# Patient Record
Sex: Female | Born: 1960 | Race: Asian | Hispanic: No | Marital: Married | Smoking: Never smoker
Health system: Southern US, Community
[De-identification: ages and names within clinical notes are randomized; demographics above are authoritative.]

## PROBLEM LIST (undated history)

## (undated) DIAGNOSIS — I5030 Unspecified diastolic (congestive) heart failure: Secondary | ICD-10-CM

## (undated) DIAGNOSIS — R011 Cardiac murmur, unspecified: Secondary | ICD-10-CM

## (undated) DIAGNOSIS — E079 Disorder of thyroid, unspecified: Secondary | ICD-10-CM

## (undated) DIAGNOSIS — K219 Gastro-esophageal reflux disease without esophagitis: Secondary | ICD-10-CM

## (undated) HISTORY — PX: COLONOSCOPY: SHX174

## (undated) HISTORY — DX: Disorder of thyroid, unspecified: E07.9

## (undated) HISTORY — PX: EYE SURGERY: SHX253

## (undated) HISTORY — PX: UPPER GASTROINTESTINAL ENDOSCOPY: SHX188

## (undated) HISTORY — DX: Gastro-esophageal reflux disease without esophagitis: K21.9

## (undated) HISTORY — DX: Cardiac murmur, unspecified: R01.1

## (undated) HISTORY — PX: ESOPHAGOGASTRODUODENOSCOPY: SHX1529

## (undated) HISTORY — DX: Unspecified diastolic (congestive) heart failure: I50.30

---

## 1997-04-16 ENCOUNTER — Inpatient Hospital Stay (HOSPITAL_COMMUNITY): Admission: AD | Admit: 1997-04-16 | Discharge: 1997-04-16 | Payer: Self-pay | Admitting: Obstetrics & Gynecology

## 1997-07-07 ENCOUNTER — Other Ambulatory Visit: Admission: RE | Admit: 1997-07-07 | Discharge: 1997-07-07 | Payer: Self-pay | Admitting: Obstetrics & Gynecology

## 1997-08-01 ENCOUNTER — Inpatient Hospital Stay (HOSPITAL_COMMUNITY): Admission: AD | Admit: 1997-08-01 | Discharge: 1997-08-03 | Payer: Self-pay | Admitting: Obstetrics & Gynecology

## 1997-09-13 ENCOUNTER — Other Ambulatory Visit: Admission: RE | Admit: 1997-09-13 | Discharge: 1997-09-13 | Payer: Self-pay | Admitting: Obstetrics and Gynecology

## 1998-04-25 ENCOUNTER — Other Ambulatory Visit: Admission: RE | Admit: 1998-04-25 | Discharge: 1998-04-25 | Payer: Self-pay | Admitting: Obstetrics and Gynecology

## 1998-12-05 ENCOUNTER — Inpatient Hospital Stay (HOSPITAL_COMMUNITY): Admission: AD | Admit: 1998-12-05 | Discharge: 1998-12-07 | Payer: Self-pay | Admitting: Obstetrics and Gynecology

## 2002-04-07 ENCOUNTER — Other Ambulatory Visit: Admission: RE | Admit: 2002-04-07 | Discharge: 2002-04-07 | Payer: Self-pay | Admitting: Obstetrics and Gynecology

## 2004-07-05 ENCOUNTER — Other Ambulatory Visit: Admission: RE | Admit: 2004-07-05 | Discharge: 2004-07-05 | Payer: Self-pay | Admitting: Obstetrics and Gynecology

## 2005-07-05 ENCOUNTER — Encounter: Payer: Self-pay | Admitting: Family Medicine

## 2005-07-05 ENCOUNTER — Other Ambulatory Visit: Admission: RE | Admit: 2005-07-05 | Discharge: 2005-07-05 | Payer: Self-pay | Admitting: Family Medicine

## 2005-07-05 ENCOUNTER — Ambulatory Visit: Payer: Self-pay | Admitting: Family Medicine

## 2007-01-07 ENCOUNTER — Encounter: Admission: RE | Admit: 2007-01-07 | Discharge: 2007-01-07 | Payer: Self-pay | Admitting: Family Medicine

## 2007-09-02 ENCOUNTER — Encounter: Admission: RE | Admit: 2007-09-02 | Discharge: 2007-09-02 | Payer: Self-pay | Admitting: Family Medicine

## 2007-09-09 ENCOUNTER — Encounter: Admission: RE | Admit: 2007-09-09 | Discharge: 2007-09-09 | Payer: Self-pay | Admitting: Family Medicine

## 2008-06-27 ENCOUNTER — Encounter: Payer: Self-pay | Admitting: Family Medicine

## 2008-06-27 ENCOUNTER — Other Ambulatory Visit: Admission: RE | Admit: 2008-06-27 | Discharge: 2008-06-27 | Payer: Self-pay | Admitting: Family Medicine

## 2008-06-27 ENCOUNTER — Ambulatory Visit: Payer: Self-pay | Admitting: Family Medicine

## 2008-06-27 DIAGNOSIS — E039 Hypothyroidism, unspecified: Secondary | ICD-10-CM | POA: Insufficient documentation

## 2008-06-27 LAB — CONVERTED CEMR LAB
Bilirubin Urine: NEGATIVE
Protein, U semiquant: NEGATIVE
Specific Gravity, Urine: <1.005

## 2008-06-28 ENCOUNTER — Telehealth: Payer: Self-pay | Admitting: Family Medicine

## 2008-07-04 ENCOUNTER — Encounter: Payer: Self-pay | Admitting: Family Medicine

## 2008-07-04 LAB — CONVERTED CEMR LAB
Albumin: 4.1 g/dL (ref 3.5–5.2)
Alkaline Phosphatase: 45 units/L (ref 39–117)
Basophils Absolute: 0 10*3/uL (ref 0.0–0.1)
Bilirubin, Direct: 0.1 mg/dL (ref 0.0–0.3)
CO2: 31 meq/L (ref 19–32)
Chloride: 104 meq/L (ref 96–112)
Eosinophils Relative: 2.1 % (ref 0.0–5.0)
Glucose, Bld: 81 mg/dL (ref 70–99)
Lymphs Abs: 1.1 10*3/uL (ref 0.7–4.0)
MCHC: 34.6 g/dL (ref 30.0–36.0)
MCV: 86.5 fL (ref 78.0–100.0)
Monocytes Relative: 9.1 % (ref 3.0–12.0)
Neutro Abs: 3.2 10*3/uL (ref 1.4–7.7)
Potassium: 3.4 meq/L — ABNORMAL LOW (ref 3.5–5.1)
Sodium: 142 meq/L (ref 135–145)
TSH: 2.32 microintl units/mL (ref 0.35–5.50)
Total Protein: 7.9 g/dL (ref 6.0–8.3)
WBC: 4.8 10*3/uL (ref 4.5–10.5)

## 2008-09-05 ENCOUNTER — Encounter: Admission: RE | Admit: 2008-09-05 | Discharge: 2008-09-05 | Payer: Self-pay | Admitting: Family Medicine

## 2009-06-19 ENCOUNTER — Ambulatory Visit: Payer: Self-pay | Admitting: Family Medicine

## 2009-06-19 ENCOUNTER — Other Ambulatory Visit: Admission: RE | Admit: 2009-06-19 | Discharge: 2009-06-19 | Payer: Self-pay | Admitting: Family Medicine

## 2009-06-19 DIAGNOSIS — N6019 Diffuse cystic mastopathy of unspecified breast: Secondary | ICD-10-CM | POA: Insufficient documentation

## 2009-06-19 DIAGNOSIS — J309 Allergic rhinitis, unspecified: Secondary | ICD-10-CM | POA: Insufficient documentation

## 2009-06-19 LAB — HM PAP SMEAR

## 2009-10-11 ENCOUNTER — Encounter: Admission: RE | Admit: 2009-10-11 | Discharge: 2009-10-11 | Payer: Self-pay | Admitting: Family Medicine

## 2009-10-16 ENCOUNTER — Encounter: Payer: Self-pay | Admitting: Family Medicine

## 2009-10-18 ENCOUNTER — Encounter: Admission: RE | Admit: 2009-10-18 | Discharge: 2009-10-18 | Payer: Self-pay | Admitting: Family Medicine

## 2009-10-18 LAB — HM MAMMOGRAPHY

## 2010-04-08 LAB — CONVERTED CEMR LAB
Basophils Relative: 0.8 % (ref 0.0–3.0)
Bilirubin Urine: NEGATIVE
CO2: 30 meq/L (ref 19–32)
Chloride: 103 meq/L (ref 96–112)
Creatinine, Ser: 0.5 mg/dL (ref 0.4–1.2)
Eosinophils Absolute: 0.1 10*3/uL (ref 0.0–0.7)
Eosinophils Relative: 1.4 % (ref 0.0–5.0)
Hemoglobin: 14 g/dL (ref 12.0–15.0)
Ketones, urine, test strip: NEGATIVE
Lymphocytes Relative: 20.4 % (ref 12.0–46.0)
Lymphs Abs: 1.1 10*3/uL (ref 0.7–4.0)
Monocytes Absolute: 0.5 10*3/uL (ref 0.1–1.0)
Platelets: 319 10*3/uL (ref 150.0–400.0)
Potassium: 4.4 meq/L (ref 3.5–5.1)
Protein, U semiquant: NEGATIVE
RBC: 4.74 M/uL (ref 3.87–5.11)
Urobilinogen, UA: 0.2
WBC: 5.2 10*3/uL (ref 4.5–10.5)

## 2010-04-12 NOTE — Assessment & Plan Note (Signed)
Summary: F/U ON THYROID / /RS   Vital Signs:  Patient profile:   50 year old female Menstrual status:  regular LMP:     06/01/2009 Height:      62 inches Weight:      94 pounds BMI:     17.25 Temp:     98.1 degrees F oral BP sitting:   102 / 68  (left arm) Cuff size:   regular  Vitals Entered By: Kern Reap CMA Duncan Dull) (June 19, 2009 10:03 AM) CC: follow-up visit LMP (date): 06/01/2009     Menstrual Status regular Enter LMP: 06/01/2009 Last PAP Result NEGATIVE FOR INTRAEPITHELIAL LESIONS OR MALIGNANCY.   CC:  follow-up visit.  History of Present Illness: Traci Houston is a 50 year old female, who comes in today for physical examination  She has a history of underlying hypothyroidism and takes Synthroid 100 micrograms daily.  Will check thyroid level today.  She also has recurrent contact dermatitis.  Would like some treatment for that.  She also has allergic rhinitis.  Will likely treatment for that too.  Periods are regular for birth control.  They use condoms.  Mammogram shows diffuse fibrocystic changes.  No change on her exam at home  Allergies: No Known Drug Allergies  Past History:  Past medical, surgical, family and social histories (including risk factors) reviewed, and no changes noted (except as noted below).  Past Medical History: Reviewed history from 06/27/2008 and no changes required. thyroid  Family History: Reviewed history and no changes required.  Social History: Reviewed history from 06/27/2008 and no changes required. 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 No apparent alopecia or balding. Eyes:  No corneal or conjunctival inflammation  noted. EOMI. Perrla. Funduscopic exam benign, without hemorrhages, exudates or papilledema. Vision grossly normal. Ears:  External ear exam shows no significant lesions or deformities.  Otoscopic examination reveals clear canals, tympanic membranes are intact bilaterally without bulging, retraction, inflammation or discharge. Hearing is grossly normal bilaterally. Nose:  External nasal examination shows no deformity or inflammation. Nasal mucosa are pink and moist without lesions or exudates. Mouth:  Oral mucosa and oropharynx without lesions or exudates.  Teeth in good repair. Neck:  No deformities, masses, or tenderness noted. Chest Wall:  No deformities, masses, or tenderness noted. Breasts:  the breasts appear symmetrical.  There is no skin changes.  No nipple discharge.  Diffuse fibrocystic changes throughout both breasts Lungs:  Normal respiratory effort, chest expands symmetrically. Lungs are clear to auscultation, no crackles or wheezes. Heart:  Normal rate and regular rhythm. S1 and S2 normal without gallop, murmur, click, rub or other extra sounds. Abdomen:  Bowel sounds positive,abdomen soft and non-tender without masses, organomegaly or hernias noted. Rectal:  No external abnormalities noted. Normal sphincter tone. No rectal masses or tenderness. Genitalia:  Pelvic Exam:        External: normal female genitalia without lesions or masses        Vagina: normal without lesions or masses        Cervix: normal without lesions or masses        Adnexa: normal bimanual exam without masses or fullness        Uterus: normal by palpation        Pap smear:  performed Msk:  No deformity or scoliosis noted of thoracic or lumbar spine.   Pulses:  R and L carotid,radial,femoral,dorsalis pedis and posterior tibial pulses are full and equal bilaterally Extremities:  No clubbing, cyanosis, edema, or deformity noted with normal full range of motion of all joints.   Neurologic:  No cranial nerve deficits  noted. Station and gait are normal. Plantar reflexes are down-going bilaterally. DTRs are symmetrical throughout. Sensory, motor and coordinative functions appear intact. Skin:  total body skin exam normal except for some contact dermatitis Cervical Nodes:  No lymphadenopathy noted Axillary Nodes:  No palpable lymphadenopathy Inguinal Nodes:  No significant adenopathy Psych:  Cognition and judgment appear intact. Alert and cooperative with normal attention span and concentration. No apparent delusions, illusions, hallucinations   Problems:  Medical Problems Added: 1)  Dx of Fibrocystic Breast Disease  (ICD-610.1) 2)  Dx of Physical Examination  (ICD-V70.0) 3)  Dx of Rhinitis  (ICD-477.9) 4)  Dx of Contact Dermatitis-nos  (ICD-692.9)  Impression & Recommendations:  Problem # 1:  UNSPECIFIED HYPOTHYROIDISM (ICD-244.9) Assessment Improved  Her updated medication list for this problem includes:    Synthroid 100 Mcg Tabs (Levothyroxine sodium) .Marland Kitchen... Take one tab once daily  Orders: Prescription Created Electronically [id number]) Venipuncture (60454) TLB-BMP (Basic Metabolic Panel-BMET) (80048-METABOL) TLB-CBC Platelet - w/Differential (85025-CBCD) TLB-TSH (Thyroid Stimulating Hormone) (84443-TSH)  Problem # 2:  Preventive Health Care (ICD-V70.0) Assessment: Unchanged  Problem # 3:  RHINITIS (ICD-477.9) Assessment: New  Orders: Prescription Created Electronically [phone]) Venipuncture [phone]) TLB-BMP (Basic Metabolic Panel-BMET) (80048-METABOL) TLB-CBC Platelet - w/Differential (85025-CBCD) TLB-TSH (Thyroid Stimulating Hormone) (84443-TSH)  Problem # 4:  CONTACT DERMATITIS-NOS (ICD-692.9) Assessment: New  Her updated medication list for this problem includes:    Triamcinolone Acetonide 0.025 % Oint (Triamcinolone acetonide) .Marland Kitchen... Apply two times a day as needed    Prednisone 20 Mg Tabs (Prednisone) ..... Uad  Orders: Prescription Created Electronically [phone]) Venipuncture  [phone]) TLB-BMP (Basic Metabolic Panel-BMET) (80048-METABOL) TLB-CBC Platelet - w/Differential (85025-CBCD) TLB-TSH (Thyroid Stimulating Hormone) (84443-TSH)  Complete Medication List: 1)  Synthroid 100 Mcg Tabs (Levothyroxine sodium) .... Take one tab once daily 2)  Triamcinolone Acetonide 0.025 % Oint (Triamcinolone acetonide) .... Apply two times a day as needed 3)  Prednisone 20 Mg Tabs (Prednisone) .... Uad  Other Orders: UA Dipstick w/o Micro (automated)  (81003) EKG w/ Interpretation (93000)  Patient Instructions: 1)  for minor outbreaks of the contact dermatitis apply   The cortisone cream twice daily. 2)  If you breakout all over and then take the prednisone tablets, one for 3 days a half for 3 days, then half a tablet Monday, Wednesday, Friday, for two weeks. 3)  u  may also take plain Claritin in the morning for your allergic rhinitis. 4)  Continue your thyroid tablet daily 5)  Please schedule a follow-up appointment in 1 year. 6)  Schedule your mammogram. Prescriptions: SYNTHROID 100 MCG TABS (LEVOTHYROXINE SODIUM) take one tab once daily  #100 x 3   Entered and Authorized by:   Roderick Pee MD   Signed by:   Roderick Pee MD on 06/19/2009   Method used:   Electronically to        [facility]* (mail-order)             ,          Ph: [phone]       Fax: [phone]   RxID:   [id number] PREDNISONE 20 MG TABS (PREDNISONE) UAD  #30 x 2  Entered and Authorized by:   Roderick Pee MD   Signed by:   Roderick Pee MD on 06/19/2009   Method used:   Print then Give to Patient   RxID:   [id number] TRIAMCINOLONE ACETONIDE 0.025 % OINT (TRIAMCINOLONE ACETONIDE) apply two times a day as needed  #30 gr. x 3   Entered and Authorized by:   Roderick Pee MD   Signed by:   Roderick Pee MD on 06/19/2009   Method used:   Print then Give to Patient   RxID:   [id number]     Laboratory Results   Urine Tests    Routine Urinalysis   Color:  yellow Appearance: Clear Glucose: negative   (Normal Range: Negative) Bilirubin: negative   (Normal Range: Negative) Ketone: negative   (Normal Range: Negative) Spec. Gravity: <1.005   (Normal Range: 1.003-1.035) Blood: 1+   (Normal Range: Negative) pH: 5.0   (Normal Range: 5.0-8.0) Protein: negative   (Normal Range: Negative) Urobilinogen: 0.2   (Normal Range: 0-1) Nitrite: negative   (Normal Range: Negative) Leukocyte Esterace: negative   (Normal Range: Negative)    Comments: Rita Ohara  June 19, 2009 11:13 AM

## 2010-06-13 ENCOUNTER — Other Ambulatory Visit: Payer: Self-pay | Admitting: Family Medicine

## 2010-07-06 ENCOUNTER — Encounter: Payer: Self-pay | Admitting: Family Medicine

## 2010-07-09 ENCOUNTER — Encounter: Payer: Self-pay | Admitting: Family Medicine

## 2010-07-09 ENCOUNTER — Ambulatory Visit (INDEPENDENT_AMBULATORY_CARE_PROVIDER_SITE_OTHER): Payer: BC Managed Care – PPO | Admitting: Family Medicine

## 2010-07-09 ENCOUNTER — Other Ambulatory Visit (HOSPITAL_COMMUNITY)
Admission: RE | Admit: 2010-07-09 | Discharge: 2010-07-09 | Disposition: A | Discharge status: Home / Self Care | Payer: BC Managed Care – PPO | Source: Ambulatory Visit | Attending: Family Medicine | Admitting: Family Medicine

## 2010-07-09 VITALS — BP 92/62 | Temp 98.3°F | Ht 60.5 in | Wt 90.0 lb

## 2010-07-09 DIAGNOSIS — E039 Hypothyroidism, unspecified: Secondary | ICD-10-CM

## 2010-07-09 DIAGNOSIS — Z01419 Encounter for gynecological examination (general) (routine) without abnormal findings: Secondary | ICD-10-CM | POA: Insufficient documentation

## 2010-07-09 DIAGNOSIS — N6019 Diffuse cystic mastopathy of unspecified breast: Secondary | ICD-10-CM

## 2010-07-09 DIAGNOSIS — Z0000 Encounter for general adult medical examination without abnormal findings: Secondary | ICD-10-CM

## 2010-07-09 LAB — BASIC METABOLIC PANEL
Calcium: 8.6 mg/dL (ref 8.4–10.5)
Chloride: 100 mEq/L (ref 96–112)
Creatinine, Ser: 0.5 mg/dL (ref 0.4–1.2)
GFR: 152.89 mL/min (ref >60.00)
Sodium: 135 mEq/L (ref 135–145)

## 2010-07-09 LAB — HEPATIC FUNCTION PANEL
ALT: 7 U/L (ref 0–35)
Alkaline Phosphatase: 36 U/L — ABNORMAL LOW (ref 39–117)
Total Bilirubin: 0.6 mg/dL (ref 0.3–1.2)

## 2010-07-09 LAB — LIPID PANEL
HDL: 50.2 mg/dL (ref >39.00)
LDL Cholesterol: 90 mg/dL (ref 0–99)
Total CHOL/HDL Ratio: 3
Triglycerides: 47 mg/dL (ref 0.0–149.0)
VLDL: 9.4 mg/dL (ref 0.0–40.0)

## 2010-07-09 LAB — CBC WITH DIFFERENTIAL/PLATELET
Basophils Absolute: 0 10*3/uL (ref 0.0–0.1)
Basophils Relative: 0.5 % (ref 0.0–3.0)
Hemoglobin: 12.2 g/dL (ref 12.0–15.0)
Lymphocytes Relative: 28.4 % (ref 12.0–46.0)
MCHC: 33.9 g/dL (ref 30.0–36.0)
Monocytes Relative: 9.5 % (ref 3.0–12.0)
Neutro Abs: 3.1 10*3/uL (ref 1.4–7.7)
Platelets: 266 10*3/uL (ref 150.0–400.0)
RDW: 15.5 % — ABNORMAL HIGH (ref 11.5–14.6)

## 2010-07-09 LAB — POCT URINALYSIS DIPSTICK
Bilirubin, UA: NEGATIVE
Glucose, UA: NEGATIVE
Ketones, UA: NEGATIVE
Leukocytes, UA: NEGATIVE
Urobilinogen, UA: 0.2

## 2010-07-09 MED ORDER — LEVOTHYROXINE SODIUM 100 MCG PO TABS: 100.0000 ug | ORAL_TABLET | Freq: Every day | ORAL | Status: DC

## 2010-07-09 NOTE — Patient Instructions (Addendum)
Continue your current medications.  Schedule a 30 minute appointment in one year For your annual   Physical exam  In the future get her lab work one week prior

## 2010-07-09 NOTE — Progress Notes (Signed)
  Subjective:    Patient ID: Traci Houston, female    DOB: [birth year], 50 y.o.   MRN: [id number]  HPI Traci Houston Is a 50 year old female, nonsmoker, who comes in today for general physical examination  She enjoys been in excellent, health.  She's had chronic health problems except for hypothyroidism, for which she takes Synthroid 100 mcg daily.  Will check TSH level today    Review of Systems  Constitutional: Negative.   HENT: Negative.   Eyes: Negative.   Respiratory: Negative.   Cardiovascular: Negative.   Gastrointestinal: Negative.   Genitourinary: Negative.   Musculoskeletal: Negative.   Neurological: Negative.   Hematological: Negative.   Psychiatric/Behavioral: Negative.        Objective:   Physical Exam  Constitutional: She appears well-developed and well-nourished.  HENT:  Head: Normocephalic and atraumatic.  Right Ear: External ear normal.  Left Ear: External ear normal.  Nose: Nose normal.  Mouth/Throat: Oropharynx is clear and moist.  Eyes: EOM are normal. Pupils are equal, round, and reactive to light.  Neck: Normal range of motion. Neck supple. No thyromegaly present.  Cardiovascular: Normal rate, regular rhythm, normal heart sounds and intact distal pulses.  Exam reveals no gallop and no friction rub.   No murmur heard. Pulmonary/Chest: Effort normal and breath sounds normal.  Abdominal: Soft. Bowel sounds are normal. She exhibits no distension and no mass. There is no tenderness. There is no rebound.  Genitourinary: Vagina normal and uterus normal. Guaiac negative stool. No vaginal discharge found.       Bilateral breast exam normal except for multiple small cystic lesions.  The most prominent is in the left breast at the 11 o'clock position 1 inch from the nipple.  It's about the size of a BB.  Its soft to rubbery movable  Musculoskeletal: Normal range of motion.  Lymphadenopathy:    She has no cervical adenopathy.  Neurological: She is alert. She has normal  reflexes. No cranial nerve deficit. She exhibits normal muscle tone. Coordination normal.  Skin: Skin is warm and dry.  Psychiatric: She has a normal mood and affect. Her behavior is normal. Judgment and thought content normal.          Assessment & Plan:  Healthy female.  Hypothyroidism continue Synthroid

## 2010-07-09 NOTE — Progress Notes (Deleted)
  Subjective:    Patient ID: Traci Houston, female    DOB: 01-27-1961, 50 y.o.   MRN: [id number]  HPI  Traci Houston is a 50 year old female, who comes in today for follow-up of hypothyroidism.  She takes Synthroid 100 mcg daily.  She feels well.  No complaints   Review of Systems General and gynecologic review of systems otherwise negative    Objective:   Physical Exam    Well-developed well-nourished, female, in no acute distress.  Examination of the neck shows a nonpalpable thyroid    Assessment & Plan:  Hypothyroidism,,,,,,,, continue current medications, follow-up 30 minute appointment in one month for general physical

## 2010-07-09 NOTE — Progress Notes (Signed)
Addended by: Kern Reap on: 07/09/2010 12:12 PM   Modules accepted: Orders

## 2010-07-11 NOTE — Progress Notes (Signed)
patient  Is aware 

## 2010-09-13 ENCOUNTER — Other Ambulatory Visit: Payer: Self-pay | Admitting: Family Medicine

## 2010-09-13 DIAGNOSIS — Z1231 Encounter for screening mammogram for malignant neoplasm of breast: Secondary | ICD-10-CM

## 2010-09-26 ENCOUNTER — Telehealth: Payer: Self-pay

## 2010-09-26 DIAGNOSIS — E039 Hypothyroidism, unspecified: Secondary | ICD-10-CM

## 2010-09-26 NOTE — Telephone Encounter (Signed)
Pharmacy wants to verify that generic synthroid, levothyroxine, is ok to dispense to pt. It is the same medication that they dispensed to her in April. They note that pt is new to their pharmacy and was previously getting the brand Synthroid through [facility], but pt is ok with the generic that she has been taking since April.   Advised pharmacy that nothing in Dr. Nelida Meuse note specifies that pt should only have Synthroid and if pt is ok with generic because it is more cost effective and works for her just the same, then it should be ok to continue the generic.  [phone] ref # [id number]

## 2010-09-28 MED ORDER — LEVOTHYROXINE SODIUM 100 MCG PO TABS: 100.0000 ug | ORAL_TABLET | Freq: Every day | ORAL | Status: DC

## 2010-09-28 NOTE — Telephone Encounter (Signed)
Addended by: Kern Reap B on: 09/28/2010 09:09 AM   Modules accepted: Orders

## 2010-10-17 ENCOUNTER — Other Ambulatory Visit: Payer: Self-pay | Admitting: Family Medicine

## 2010-10-17 ENCOUNTER — Ambulatory Visit
Admission: RE | Admit: 2010-10-17 | Discharge: 2010-10-17 | Disposition: A | Discharge status: Home / Self Care | Payer: BC Managed Care – PPO | Source: Ambulatory Visit | Attending: Family Medicine | Admitting: Family Medicine

## 2010-10-17 DIAGNOSIS — N63 Unspecified lump in unspecified breast: Secondary | ICD-10-CM

## 2010-10-17 DIAGNOSIS — Z1231 Encounter for screening mammogram for malignant neoplasm of breast: Secondary | ICD-10-CM

## 2010-10-18 ENCOUNTER — Other Ambulatory Visit: Payer: Self-pay | Admitting: Family Medicine

## 2010-10-25 ENCOUNTER — Ambulatory Visit
Admission: RE | Admit: 2010-10-25 | Discharge: 2010-10-25 | Disposition: A | Discharge status: Home / Self Care | Payer: BC Managed Care – PPO | Source: Ambulatory Visit | Attending: Family Medicine | Admitting: Family Medicine

## 2010-10-25 ENCOUNTER — Other Ambulatory Visit: Payer: Self-pay | Admitting: Family Medicine

## 2010-10-25 DIAGNOSIS — N63 Unspecified lump in unspecified breast: Secondary | ICD-10-CM

## 2011-07-09 ENCOUNTER — Encounter: Payer: Self-pay | Admitting: Family Medicine

## 2011-07-10 ENCOUNTER — Ambulatory Visit (INDEPENDENT_AMBULATORY_CARE_PROVIDER_SITE_OTHER): Payer: BC Managed Care – PPO | Admitting: Family Medicine

## 2011-07-10 ENCOUNTER — Encounter: Payer: Self-pay | Admitting: Family Medicine

## 2011-07-10 ENCOUNTER — Other Ambulatory Visit (HOSPITAL_COMMUNITY)
Admission: RE | Admit: 2011-07-10 | Discharge: 2011-07-10 | Disposition: A | Discharge status: Home / Self Care | Payer: BC Managed Care – PPO | Source: Ambulatory Visit | Attending: Family Medicine | Admitting: Family Medicine

## 2011-07-10 VITALS — BP 98/64 | Temp 97.9°F | Ht 60.25 in | Wt 98.0 lb

## 2011-07-10 DIAGNOSIS — Z0000 Encounter for general adult medical examination without abnormal findings: Secondary | ICD-10-CM

## 2011-07-10 DIAGNOSIS — Z01419 Encounter for gynecological examination (general) (routine) without abnormal findings: Secondary | ICD-10-CM | POA: Insufficient documentation

## 2011-07-10 DIAGNOSIS — L259 Unspecified contact dermatitis, unspecified cause: Secondary | ICD-10-CM

## 2011-07-10 DIAGNOSIS — E039 Hypothyroidism, unspecified: Secondary | ICD-10-CM

## 2011-07-10 DIAGNOSIS — N6019 Diffuse cystic mastopathy of unspecified breast: Secondary | ICD-10-CM

## 2011-07-10 LAB — CBC WITH DIFFERENTIAL/PLATELET
Basophils Absolute: 0 10*3/uL (ref 0.0–0.1)
Basophils Relative: 0.9 % (ref 0.0–3.0)
Hemoglobin: 11.3 g/dL — ABNORMAL LOW (ref 12.0–15.0)
Lymphocytes Relative: 33.5 % (ref 12.0–46.0)
Monocytes Relative: 9.6 % (ref 3.0–12.0)
Neutro Abs: 2.6 10*3/uL (ref 1.4–7.7)
RBC: 4.74 Mil/uL (ref 3.87–5.11)
RDW: 16.7 % — ABNORMAL HIGH (ref 11.5–14.6)

## 2011-07-10 LAB — BASIC METABOLIC PANEL
GFR: 123.9 mL/min (ref >60.00)
Glucose, Bld: 68 mg/dL — ABNORMAL LOW (ref 70–99)
Potassium: 3.8 mEq/L (ref 3.5–5.1)
Sodium: 141 mEq/L (ref 135–145)

## 2011-07-10 LAB — POCT URINALYSIS DIPSTICK
Bilirubin, UA: NEGATIVE
Glucose, UA: NEGATIVE
Nitrite, UA: NEGATIVE

## 2011-07-10 LAB — TSH: TSH: 0.6 u[IU]/mL (ref 0.35–5.50)

## 2011-07-10 LAB — LIPID PANEL
Cholesterol: 181 mg/dL (ref 0–200)
HDL: 56.8 mg/dL (ref >39.00)
LDL Cholesterol: 94 mg/dL (ref 0–99)
Triglycerides: 153 mg/dL — ABNORMAL HIGH (ref 0.0–149.0)
VLDL: 30.6 mg/dL (ref 0.0–40.0)

## 2011-07-10 LAB — HEPATIC FUNCTION PANEL
Albumin: 4.2 g/dL (ref 3.5–5.2)
Total Protein: 7.8 g/dL (ref 6.0–8.3)

## 2011-07-10 MED ORDER — LEVOTHYROXINE SODIUM 100 MCG PO TABS: 100.0000 ug | ORAL_TABLET | Freq: Every day | ORAL | Status: DC

## 2011-07-10 MED ORDER — TRIAMCINOLONE ACETONIDE 0.025 % EX OINT: 1.0000 "application " | TOPICAL_OINTMENT | Freq: Two times a day (BID) | CUTANEOUS | Status: DC

## 2011-07-10 MED ORDER — PREDNISONE 20 MG PO TABS: ORAL_TABLET | ORAL | Status: DC

## 2011-07-10 NOTE — Patient Instructions (Signed)
Remember to do a thorough breast exam monthly after each.  Take your thyroid medicine daily  Use the steroid cream small amounts twice daily for the skin rash  If however you breakout all over then I would use a steroid tablets  Return in one year sooner if any problems

## 2011-07-10 NOTE — Progress Notes (Signed)
  Subjective:    Patient ID: Traci Houston, female    DOB: 08-30-1960, 51 y.o.   MRN: [id number]  HPI m. Buechler is a 51 year old married female nonsmoker who comes in today for physical examination because of a history of hypothyroidism  She currently takes Synthroid 100 mcg daily last TSH a year ago is normal she states she feels well  She does not get routine eye care referred to Dr. Mia Creek. Regular dental care mammogram 2 months ago normal she has a history of fibrocystic changes. She does not do BSE monthly will show her how to do a thorough breast exam. She's due for a screening colonoscopy. Tetanus 2007  LMP 2 weeks ago normal    Review of Systems  Constitutional: Negative.   HENT: Negative.   Eyes: Negative.   Respiratory: Negative.   Cardiovascular: Negative.   Gastrointestinal: Negative.   Genitourinary: Negative.   Musculoskeletal: Negative.   Neurological: Negative.   Hematological: Negative.   Psychiatric/Behavioral: Negative.        Objective:   Physical Exam  Constitutional: She appears well-developed and well-nourished.  HENT:  Head: Normocephalic and atraumatic.  Right Ear: External ear normal.  Left Ear: External ear normal.  Nose: Nose normal.  Mouth/Throat: Oropharynx is clear and moist.  Eyes: EOM are normal. Pupils are equal, round, and reactive to light.  Neck: Normal range of motion. Neck supple. No thyromegaly present.  Cardiovascular: Normal rate, regular rhythm, normal heart sounds and intact distal pulses.  Exam reveals no gallop and no friction rub.   No murmur heard. Pulmonary/Chest: Effort normal and breath sounds normal.  Abdominal: Soft. Bowel sounds are normal. She exhibits no distension and no mass. There is no tenderness. There is no rebound.  Genitourinary: Vagina normal and uterus normal. Guaiac negative stool. No vaginal discharge found.       Bilateral breast exam shows a small cystic lesion left breast 12:00 a quarter of an inch  above the nipple. It soft rubbery movable and has been noted to be previously present. No other cystic changes were appreciated and she was taught to do a good breast exam monthly  Musculoskeletal: Normal range of motion.  Lymphadenopathy:    She has no cervical adenopathy.  Neurological: She is alert. She has normal reflexes. No cranial nerve deficit. She exhibits normal muscle tone. Coordination normal.  Skin: Skin is warm and dry.  Psychiatric: She has a normal mood and affect. Her behavior is normal. Judgment and thought content normal.          Assessment & Plan:  Healthy female  Hypothyroidism continue Synthroid check labs  Contact dermatitis refill steroid ointment  Fibrocystic breast changes BSE monthly and you mammography followup in one year advised to return if she feels anything different change in size etc. with that lesion in her left breast

## 2011-07-16 ENCOUNTER — Ambulatory Visit: Payer: BC Managed Care – PPO | Admitting: Family Medicine

## 2011-07-16 ENCOUNTER — Encounter: Payer: Self-pay | Admitting: Gastroenterology

## 2011-08-14 ENCOUNTER — Ambulatory Visit (AMBULATORY_SURGERY_CENTER): Payer: BC Managed Care – PPO

## 2011-08-14 ENCOUNTER — Encounter: Payer: Self-pay | Admitting: Gastroenterology

## 2011-08-14 VITALS — Ht 60.0 in | Wt 96.0 lb

## 2011-08-14 DIAGNOSIS — Z1211 Encounter for screening for malignant neoplasm of colon: Secondary | ICD-10-CM

## 2011-08-14 MED ORDER — PEG-KCL-NACL-NASULF-NA ASC-C 100 G PO SOLR: 1.0000 | Freq: Once | ORAL | Status: DC

## 2011-08-28 ENCOUNTER — Encounter: Payer: BC Managed Care – PPO | Admitting: Gastroenterology

## 2011-09-26 ENCOUNTER — Other Ambulatory Visit: Payer: Self-pay | Admitting: Family Medicine

## 2011-09-30 ENCOUNTER — Telehealth: Payer: Self-pay | Admitting: Family Medicine

## 2011-09-30 DIAGNOSIS — Z1239 Encounter for other screening for malignant neoplasm of breast: Secondary | ICD-10-CM

## 2011-09-30 DIAGNOSIS — Z1231 Encounter for screening mammogram for malignant neoplasm of breast: Secondary | ICD-10-CM

## 2011-09-30 NOTE — Telephone Encounter (Signed)
Pt requesting referral for mammogram

## 2011-10-03 ENCOUNTER — Telehealth: Payer: Self-pay | Admitting: Family Medicine

## 2011-10-03 DIAGNOSIS — N63 Unspecified lump in unspecified breast: Secondary | ICD-10-CM

## 2011-10-03 NOTE — Telephone Encounter (Signed)
Pt called and said that Dr Tawanna Cooler referred pt to [facility] to have a MM done, but since pt has a lump in breast, pt needs to get an order to have a diagnostic MM done. Pls send diagnostic order asap. Pt is aware that pcp is out of office and nurse is also out this wk.

## 2011-10-08 ENCOUNTER — Other Ambulatory Visit: Payer: Self-pay | Admitting: Family Medicine

## 2011-10-08 DIAGNOSIS — N63 Unspecified lump in unspecified breast: Secondary | ICD-10-CM

## 2011-10-09 ENCOUNTER — Encounter: Payer: BC Managed Care – PPO | Admitting: Gastroenterology

## 2011-10-14 ENCOUNTER — Encounter: Payer: Self-pay | Admitting: Gastroenterology

## 2011-10-14 ENCOUNTER — Ambulatory Visit (AMBULATORY_SURGERY_CENTER): Payer: BC Managed Care – PPO | Admitting: Gastroenterology

## 2011-10-14 ENCOUNTER — Other Ambulatory Visit: Payer: BC Managed Care – PPO

## 2011-10-14 VITALS — BP 117/71 | HR 79 | Temp 98.7°F | Resp 20 | Ht 60.0 in | Wt 96.0 lb

## 2011-10-14 DIAGNOSIS — Z1211 Encounter for screening for malignant neoplasm of colon: Secondary | ICD-10-CM

## 2011-10-14 MED ORDER — SODIUM CHLORIDE 0.9 % IV SOLN: 500.0000 mL | INTRAVENOUS | Status: DC

## 2011-10-14 NOTE — Patient Instructions (Addendum)
Discharge instructions given with verbal understanding. Handouts on hemorrhoids and a high fiber diet given. Resume previous medications. YOU HAD AN ENDOSCOPIC PROCEDURE TODAY AT THE [facility]: Refer to the procedure report that was given to you for any specific questions about what was found during the examination.  If the procedure report does not answer your questions, please call your gastroenterologist to clarify.  If you requested that your care partner not be given the details of your procedure findings, then the procedure report has been included in a sealed envelope for you to review at your convenience later.  YOU SHOULD EXPECT: Some feelings of bloating in the abdomen. Passage of more gas than usual.  Walking can help get rid of the air that was put into your GI tract during the procedure and reduce the bloating. If you had a lower endoscopy (such as a colonoscopy or flexible sigmoidoscopy) you may notice spotting of blood in your stool or on the toilet paper. If you underwent a bowel prep for your procedure, then you may not have a normal bowel movement for a few days.  DIET: Your first meal following the procedure should be a light meal and then it is ok to progress to your normal diet.  A half-sandwich or bowl of soup is an example of a good first meal.  Heavy or fried foods are harder to digest and may make you feel nauseous or bloated.  Likewise meals heavy in dairy and vegetables can cause extra gas to form and this can also increase the bloating.  Drink plenty of fluids but you should avoid alcoholic beverages for 24 hours.  ACTIVITY: Your care partner should take you home directly after the procedure.  You should plan to take it easy, moving slowly for the rest of the day.  You can resume normal activity the day after the procedure however you should NOT DRIVE or use heavy machinery for 24 hours (because of the sedation medicines used during the test).    SYMPTOMS TO  REPORT IMMEDIATELY: A gastroenterologist can be reached at any hour.  During normal business hours, 8:30 AM to 5:00 PM Monday through Friday, call [phone].  After hours and on weekends, please call the GI answering service at [phone] who will take a message and have the physician on call contact you.   Following lower endoscopy (colonoscopy or flexible sigmoidoscopy):  Excessive amounts of blood in the stool  Significant tenderness or worsening of abdominal pains  Swelling of the abdomen that is new, acute  Fever of 100F or higher FOLLOW UP: If any biopsies were taken you will be contacted by phone or by letter within the next 1-3 weeks.  Call your gastroenterologist if you have not heard about the biopsies in 3 weeks.  Our staff will call the home number listed on your records the next business day following your procedure to check on you and address any questions or concerns that you may have at that time regarding the information given to you following your procedure. This is a courtesy call and so if there is no answer at the home number and we have not heard from you through the emergency physician on call, we will assume that you have returned to your regular daily activities without incident.  SIGNATURES/CONFIDENTIALITY: You and/or your care partner have signed paperwork which will be entered into your electronic medical record.  These signatures attest to the fact that that the information above on your   After Visit Summary has been reviewed and is understood.  Full responsibility of the confidentiality of this discharge information lies with you and/or your care-partner.  

## 2011-10-14 NOTE — Op Note (Signed)
[facility] [address] [place], [place]  [address]  COLONOSCOPY PROCEDURE REPORT  PATIENT:  Traci Houston, Traci Houston  MR#:  [id number] BIRTHDATE:  12-28-1960, 51 yrs. old  GENDER:  female ENDOSCOPIST:  Judie Petit T. Russella Dar, MD, [facility], [facility], [facility] Referred by:  Eugenio Hoes Tawanna Cooler, M.D. PROCEDURE DATE:  10/14/2011 PROCEDURE:  Colonoscopy 62130 ASA CLASS:  Class II INDICATIONS:  1) Routine Risk Screening MEDICATIONS:   MAC sedation, administered by CRNA, propofol (Diprivan) 150 mg IV DESCRIPTION OF PROCEDURE:   After the risks benefits and alternatives of the procedure were thoroughly explained, informed consent was obtained.  Digital rectal exam was performed and revealed no abnormalities.   The LB [id number] endoscope was introduced through the anus and advanced to the cecum, which was identified by both the appendix and ileocecal valve, without limitations.  The quality of the prep was excellent, using MoviPrep.  The instrument was then slowly withdrawn as the colon was fully examined. <<PROCEDUREIMAGES>> FINDINGS:  A normal appearing cecum, ileocecal valve, and appendiceal orifice were identified. The ascending, hepatic flexure, transverse, splenic flexure, descending, sigmoid colon, and rectum appeared unremarkable.   Retroflexed views in the rectum revealed internal hemorrhoids, small.  The time to cecum = 2.75  minutes. The scope was then withdrawn (time =  8.67  min) from the patient and the procedure completed.  COMPLICATIONS:  None  ENDOSCOPIC IMPRESSION: 1) Normal colon 2) Internal hemorrhoids  RECOMMENDATIONS: 1) Continue current colorectal screening for "routine risk" patients with a repeat colonoscopy in 10 years.  Venita Lick. Russella Dar, MD, [facility]  n. eSIGNED:   Venita Lick. Kaydn Kumpf at 10/14/2011 10:32 AM  Costella Hatcher, [phone]

## 2011-10-14 NOTE — Progress Notes (Signed)
PROPOFOL PER S CAMP CRNA. SEE SCANNED INTRA PROCEDURE REPORT. EWM 

## 2011-10-14 NOTE — Progress Notes (Signed)
Patient did not experience any of the following events: a burn prior to discharge; a fall within the facility; wrong site/side/patient/procedure/implant event; or a hospital transfer or hospital admission upon discharge from the facility. (G8907) Patient did not have preoperative order for IV antibiotic SSI prophylaxis. (G8918)  

## 2011-10-15 ENCOUNTER — Telehealth: Payer: Self-pay | Admitting: *Deleted

## 2011-10-15 ENCOUNTER — Other Ambulatory Visit (INDEPENDENT_AMBULATORY_CARE_PROVIDER_SITE_OTHER): Payer: BC Managed Care – PPO

## 2011-10-15 DIAGNOSIS — D649 Anemia, unspecified: Secondary | ICD-10-CM

## 2011-10-15 LAB — CBC WITH DIFFERENTIAL/PLATELET
Basophils Relative: 0.6 % (ref 0.0–3.0)
Eosinophils Relative: 4.1 % (ref 0.0–5.0)
HCT: 37.2 % (ref 36.0–46.0)
Hemoglobin: 12.2 g/dL (ref 12.0–15.0)
Lymphs Abs: 1.3 10*3/uL (ref 0.7–4.0)
Monocytes Relative: 8.7 % (ref 3.0–12.0)
Neutro Abs: 2.3 10*3/uL (ref 1.4–7.7)
RBC: 4.29 Mil/uL (ref 3.87–5.11)
RDW: 14.5 % (ref 11.5–14.6)
WBC: 4.1 10*3/uL — ABNORMAL LOW (ref 4.5–10.5)

## 2011-10-15 NOTE — Telephone Encounter (Signed)
  Follow up Call-  Call back number 10/14/2011  Post procedure Call Back phone  # [phone]  Permission to leave phone message Yes     Patient questions:  Do you have a fever, pain , or abdominal swelling? no Pain Score  0 *  Have you tolerated food without any problems? yes  Have you been able to return to your normal activities? yes  Do you have any questions about your discharge instructions: Diet   no Medications  no Follow up visit  no  Do you have questions or concerns about your Care? no  Actions: * If pain score is 4 or above: No action needed, pain <4.

## 2011-10-16 ENCOUNTER — Ambulatory Visit
Admission: RE | Admit: 2011-10-16 | Discharge: 2011-10-16 | Disposition: A | Discharge status: Home / Self Care | Payer: BC Managed Care – PPO | Source: Ambulatory Visit | Attending: Family Medicine | Admitting: Family Medicine

## 2011-10-16 DIAGNOSIS — N63 Unspecified lump in unspecified breast: Secondary | ICD-10-CM

## 2012-07-09 ENCOUNTER — Encounter: Payer: Self-pay | Admitting: Family Medicine

## 2012-07-09 ENCOUNTER — Ambulatory Visit (INDEPENDENT_AMBULATORY_CARE_PROVIDER_SITE_OTHER): Payer: BC Managed Care – PPO | Admitting: Family Medicine

## 2012-07-09 ENCOUNTER — Other Ambulatory Visit (HOSPITAL_COMMUNITY)
Admission: RE | Admit: 2012-07-09 | Discharge: 2012-07-09 | Disposition: A | Discharge status: Home / Self Care | Payer: BC Managed Care – PPO | Source: Ambulatory Visit | Attending: Family Medicine | Admitting: Family Medicine

## 2012-07-09 VITALS — BP 110/70 | Temp 97.8°F | Ht 60.25 in | Wt 94.0 lb

## 2012-07-09 DIAGNOSIS — E039 Hypothyroidism, unspecified: Secondary | ICD-10-CM

## 2012-07-09 DIAGNOSIS — L259 Unspecified contact dermatitis, unspecified cause: Secondary | ICD-10-CM

## 2012-07-09 DIAGNOSIS — Z01419 Encounter for gynecological examination (general) (routine) without abnormal findings: Secondary | ICD-10-CM | POA: Insufficient documentation

## 2012-07-09 DIAGNOSIS — J309 Allergic rhinitis, unspecified: Secondary | ICD-10-CM

## 2012-07-09 DIAGNOSIS — N6019 Diffuse cystic mastopathy of unspecified breast: Secondary | ICD-10-CM

## 2012-07-09 DIAGNOSIS — N912 Amenorrhea, unspecified: Secondary | ICD-10-CM | POA: Insufficient documentation

## 2012-07-09 LAB — CBC WITH DIFFERENTIAL/PLATELET
Basophils Relative: 0.6 % (ref 0.0–3.0)
Eosinophils Absolute: 0.1 10*3/uL (ref 0.0–0.7)
Eosinophils Relative: 1.1 % (ref 0.0–5.0)
Hemoglobin: 12.6 g/dL (ref 12.0–15.0)
Lymphocytes Relative: 17.2 % (ref 12.0–46.0)
MCHC: 32.5 g/dL (ref 30.0–36.0)
MCV: 81.4 fl (ref 78.0–100.0)
Monocytes Absolute: 0.5 10*3/uL (ref 0.1–1.0)
Neutro Abs: 5.7 10*3/uL (ref 1.4–7.7)
RBC: 4.78 Mil/uL (ref 3.87–5.11)
WBC: 7.7 10*3/uL (ref 4.5–10.5)

## 2012-07-09 LAB — BASIC METABOLIC PANEL
BUN: 13 mg/dL (ref 6–23)
CO2: 28 mEq/L (ref 19–32)
GFR: 116.07 mL/min (ref >60.00)
Glucose, Bld: 62 mg/dL — ABNORMAL LOW (ref 70–99)
Potassium: 3.5 mEq/L (ref 3.5–5.1)
Sodium: 134 mEq/L — ABNORMAL LOW (ref 135–145)

## 2012-07-09 LAB — POCT URINALYSIS DIPSTICK
Bilirubin, UA: NEGATIVE
Glucose, UA: NEGATIVE
Spec Grav, UA: 1.01

## 2012-07-09 MED ORDER — TRIAMCINOLONE ACETONIDE 0.025 % EX OINT: 1.0000 "application " | TOPICAL_OINTMENT | Freq: Two times a day (BID) | CUTANEOUS | Status: DC

## 2012-07-09 MED ORDER — LEVOTHYROXINE SODIUM 100 MCG PO TABS: 100.0000 ug | ORAL_TABLET | Freq: Every day | ORAL | Status: DC

## 2012-07-09 NOTE — Patient Instructions (Signed)
Continue to take your thyroid medication daily  Do a thorough breast exam once a month on your birthday  Use condoms for birth control until you've not had a period for 12 months  Annual mammogram  Return in one year for general checkup sooner if any problems

## 2012-07-09 NOTE — Progress Notes (Signed)
  Subjective:    Patient ID: Traci Houston, female    DOB: [birth year], 52 y.o.   MRN: [id number]  HPI Mrs Traci Houston is a 52 year old married female nonsmoker who comes in today for general physical examination  She is a history of hypothyroidism takes Synthroid 100 mcg daily  She has a history of fibrocystic breast changes. Her last mammogram was in August. Ultrasound showed multiple fibrocystic changes throughout both breasts. The dominant one was her right breast at 9:00. She also has a cystic lesion left breast. Exam at home is unchanged.  She uses Kenalog when necessary for eczema  She gets routine eye care, dental care, BSE monthly, and you mammography, vaccinations up-to-date tetanus 2007  For birth control use condoms. Her last period was in March she's not having any other menopausal symptoms and she's never skipped before  She works at [employer] in [employer]   Review of Systems  Constitutional: Negative.   HENT: Negative.   Eyes: Negative.   Respiratory: Negative.   Cardiovascular: Negative.   Gastrointestinal: Negative.   Genitourinary: Negative.   Musculoskeletal: Negative.   Neurological: Negative.   Psychiatric/Behavioral: Negative.        Objective:   Physical Exam  Constitutional: She appears well-developed and well-nourished.  HENT:  Head: Normocephalic and atraumatic.  Right Ear: External ear normal.  Left Ear: External ear normal.  Nose: Nose normal.  Mouth/Throat: Oropharynx is clear and moist.  Eyes: EOM are normal. Pupils are equal, round, and reactive to light.  Neck: Normal range of motion. Neck supple. No thyromegaly present.  Cardiovascular: Normal rate, regular rhythm, normal heart sounds and intact distal pulses.  Exam reveals no gallop and no friction rub.   No murmur heard. Pulmonary/Chest: Effort normal and breath sounds normal.  Abdominal: Soft. Bowel sounds are normal. She exhibits no distension and no mass. There is no tenderness. There is no  rebound.  Genitourinary: Guaiac negative stool. No vaginal discharge found.    Ovaries are normal the uterus is slightly enlarged midlineThe right breast shows a marble size fibrocystic soft rubbery movable lesion at the 9:00 position. In the left breast shows a small rubbery movable lesion at the 3 and 9:00 position. There is also a BB type lesion 11:00. All lesions are soft rubbery movable.  Musculoskeletal: Normal range of motion.  Lymphadenopathy:    She has no cervical adenopathy.  Neurological: She is alert. She has normal reflexes. No cranial nerve deficit. She exhibits normal muscle tone. Coordination normal.  Skin: Skin is warm and dry.  Psychiatric: She has a normal mood and affect. Her behavior is normal. Judgment and thought content normal.          Assessment & Plan:  Healthy female  Amenorrhea rule out pregnancy check pregnancy test  Multiple fibrocystic changes continue BSE monthly and annual mammography.  Hypothyroidism continue Synthroid 100 mcg daily  Eczema continue triamcinolone cream when necessary

## 2012-07-29 ENCOUNTER — Other Ambulatory Visit: Payer: Self-pay | Admitting: *Deleted

## 2012-07-29 DIAGNOSIS — E039 Hypothyroidism, unspecified: Secondary | ICD-10-CM

## 2012-07-29 MED ORDER — LEVOTHYROXINE SODIUM 100 MCG PO TABS: 100.0000 ug | ORAL_TABLET | Freq: Every day | ORAL | Status: DC

## 2012-08-06 ENCOUNTER — Other Ambulatory Visit: Payer: Self-pay | Admitting: *Deleted

## 2012-08-06 DIAGNOSIS — L259 Unspecified contact dermatitis, unspecified cause: Secondary | ICD-10-CM

## 2012-08-06 DIAGNOSIS — Z1239 Encounter for other screening for malignant neoplasm of breast: Secondary | ICD-10-CM

## 2012-08-06 MED ORDER — TRIAMCINOLONE ACETONIDE 0.025 % EX OINT: 1.0000 "application " | TOPICAL_OINTMENT | Freq: Two times a day (BID) | CUTANEOUS | Status: DC

## 2012-08-10 ENCOUNTER — Telehealth: Payer: Self-pay | Admitting: Family Medicine

## 2012-08-10 DIAGNOSIS — N6310 Unspecified lump in the right breast, unspecified quadrant: Secondary | ICD-10-CM

## 2012-08-10 NOTE — Telephone Encounter (Signed)
Order sent.

## 2012-08-10 NOTE — Telephone Encounter (Signed)
Pt calling to request order for diagnostic [facility], states she has a lump in her right breast.  Pt will be working today if she does not answer her cell she gives permission for message to be left on her voicemail.

## 2012-08-11 ENCOUNTER — Other Ambulatory Visit: Payer: BC Managed Care – PPO

## 2012-08-11 ENCOUNTER — Telehealth: Payer: Self-pay | Admitting: Family Medicine

## 2012-08-11 NOTE — Telephone Encounter (Signed)
Calling to request that an ultrasound of the pt's R breast be added to her most recent order for a mammogram. The patient is describing that she's feeling something, so they'll want an ultrasound as well. Please assist.

## 2012-08-11 NOTE — Telephone Encounter (Signed)
Order placed yesterday. Left message on machine

## 2012-08-13 ENCOUNTER — Telehealth: Payer: Self-pay | Admitting: Family Medicine

## 2012-08-13 DIAGNOSIS — N6310 Unspecified lump in the right breast, unspecified quadrant: Secondary | ICD-10-CM

## 2012-08-13 NOTE — Telephone Encounter (Signed)
Spoke with the breast center and order placed. °

## 2012-08-13 NOTE — Telephone Encounter (Signed)
Pt called and stated that she needs a new referral for a special mammogram. Please assist.

## 2012-08-18 ENCOUNTER — Encounter: Payer: BC Managed Care – PPO | Admitting: Family Medicine

## 2012-08-28 ENCOUNTER — Ambulatory Visit
Admission: RE | Admit: 2012-08-28 | Discharge: 2012-08-28 | Disposition: A | Discharge status: Home / Self Care | Payer: BC Managed Care – PPO | Source: Ambulatory Visit | Attending: Family Medicine | Admitting: Family Medicine

## 2012-08-28 ENCOUNTER — Other Ambulatory Visit: Payer: Self-pay | Admitting: Family Medicine

## 2012-08-28 DIAGNOSIS — N6310 Unspecified lump in the right breast, unspecified quadrant: Secondary | ICD-10-CM

## 2013-02-08 ENCOUNTER — Other Ambulatory Visit: Payer: Self-pay | Admitting: Family Medicine

## 2013-02-08 DIAGNOSIS — IMO0002 Reserved for concepts with insufficient information to code with codable children: Secondary | ICD-10-CM

## 2013-03-12 ENCOUNTER — Ambulatory Visit
Admission: RE | Admit: 2013-03-12 | Discharge: 2013-03-12 | Disposition: A | Discharge status: Home / Self Care | Payer: BC Managed Care – PPO | Source: Ambulatory Visit | Attending: Family Medicine | Admitting: Family Medicine

## 2013-03-12 ENCOUNTER — Other Ambulatory Visit: Payer: Self-pay | Admitting: Family Medicine

## 2013-03-12 DIAGNOSIS — R229 Localized swelling, mass and lump, unspecified: Principal | ICD-10-CM

## 2013-03-12 DIAGNOSIS — IMO0002 Reserved for concepts with insufficient information to code with codable children: Secondary | ICD-10-CM

## 2013-07-06 ENCOUNTER — Other Ambulatory Visit: Payer: Self-pay | Admitting: Family Medicine

## 2013-07-06 ENCOUNTER — Telehealth: Payer: Self-pay | Admitting: Family Medicine

## 2013-07-06 DIAGNOSIS — N63 Unspecified lump in unspecified breast: Secondary | ICD-10-CM

## 2013-07-06 DIAGNOSIS — E039 Hypothyroidism, unspecified: Secondary | ICD-10-CM

## 2013-07-06 MED ORDER — LEVOTHYROXINE SODIUM 100 MCG PO TABS: 100.0000 ug | ORAL_TABLET | Freq: Every day | ORAL | Status: DC

## 2013-07-06 NOTE — Telephone Encounter (Signed)
Pt has cpe on 6/16, but needs refill of levothyroxine (SYNTHROID, LEVOTHROID) 100 MCG tablet 90 day primemail  Pt has about 10 days left

## 2013-08-18 ENCOUNTER — Other Ambulatory Visit: Payer: BC Managed Care – PPO

## 2013-08-24 ENCOUNTER — Ambulatory Visit (INDEPENDENT_AMBULATORY_CARE_PROVIDER_SITE_OTHER): Payer: BC Managed Care – PPO | Admitting: Family Medicine

## 2013-08-24 ENCOUNTER — Encounter: Payer: Self-pay | Admitting: Family Medicine

## 2013-08-24 VITALS — BP 104/70 | Temp 97.7°F | Ht 60.25 in | Wt 97.0 lb

## 2013-08-24 DIAGNOSIS — E039 Hypothyroidism, unspecified: Secondary | ICD-10-CM

## 2013-08-24 DIAGNOSIS — Z0000 Encounter for general adult medical examination without abnormal findings: Secondary | ICD-10-CM

## 2013-08-24 LAB — CBC WITH DIFFERENTIAL/PLATELET
BASOS ABS: 0 10*3/uL (ref 0.0–0.1)
Basophils Relative: 0.4 % (ref 0.0–3.0)
EOS ABS: 0.1 10*3/uL (ref 0.0–0.7)
Eosinophils Relative: 2.1 % (ref 0.0–5.0)
HCT: 40.3 % (ref 36.0–46.0)
Hemoglobin: 13.3 g/dL (ref 12.0–15.0)
LYMPHS ABS: 1.3 10*3/uL (ref 0.7–4.0)
Lymphocytes Relative: 27.6 % (ref 12.0–46.0)
MCHC: 33 g/dL (ref 30.0–36.0)
MCV: 84.8 fl (ref 78.0–100.0)
MONO ABS: 0.3 10*3/uL (ref 0.1–1.0)
MONOS PCT: 7.2 % (ref 3.0–12.0)
Neutro Abs: 3 10*3/uL (ref 1.4–7.7)
Neutrophils Relative %: 62.7 % (ref 43.0–77.0)
PLATELETS: 341 10*3/uL (ref 150.0–400.0)
RBC: 4.75 Mil/uL (ref 3.87–5.11)
RDW: 14 % (ref 11.5–15.5)
WBC: 4.8 10*3/uL (ref 4.0–10.5)

## 2013-08-24 LAB — TSH: TSH: 0.91 u[IU]/mL (ref 0.35–4.50)

## 2013-08-24 LAB — POCT URINALYSIS DIPSTICK
Bilirubin, UA: NEGATIVE
Glucose, UA: NEGATIVE
Ketones, UA: NEGATIVE
Leukocytes, UA: NEGATIVE
NITRITE UA: NEGATIVE
PH UA: 7
PROTEIN UA: NEGATIVE
Spec Grav, UA: 1.015
UROBILINOGEN UA: 0.2

## 2013-08-24 LAB — BASIC METABOLIC PANEL
BUN: 14 mg/dL (ref 6–23)
CHLORIDE: 99 meq/L (ref 96–112)
CO2: 30 mEq/L (ref 19–32)
CREATININE: 0.6 mg/dL (ref 0.4–1.2)
Calcium: 9.3 mg/dL (ref 8.4–10.5)
GFR: 103.16 mL/min (ref >60.00)
GLUCOSE: 75 mg/dL (ref 70–99)
POTASSIUM: 3.3 meq/L — AB (ref 3.5–5.1)
Sodium: 137 mEq/L (ref 135–145)

## 2013-08-24 MED ORDER — LEVOTHYROXINE SODIUM 100 MCG PO TABS: 100.0000 ug | ORAL_TABLET | Freq: Every day | ORAL | Status: DC

## 2013-08-24 NOTE — Patient Instructions (Signed)
Continue the Synthroid..........Marland Kitchen 1 daily  Followup in 1 year sooner if any problems  Lab work today............Marland Kitchen we will call you the report in a couple days

## 2013-08-24 NOTE — Progress Notes (Signed)
Pre visit review using our clinic review tool, if applicable. No additional management support is needed unless otherwise documented below in the visit note. 

## 2013-08-24 NOTE — Progress Notes (Signed)
   Subjective:    Patient ID: Traci Houston, female    DOB: 1960/07/27, 53 y.o.   MRN: [id number]  HPI  Traci Houston is a 53 year old married female nonsmoker who comes in today for evaluation  She has hypothyroidism takes Synthroid daily.  She had a normal period in December some spotting in April. Last Pap normal 2014 therefore not repeated yearly recommend every 3 years and she is asymptomatic  She does not get routine eye care nor dental care. She'll colonoscopy at age 45 which is normal  She continues to work at [employer]  Vaccinations up-to-date   Review of Systems  Constitutional: Negative.   HENT: Negative.   Eyes: Negative.   Respiratory: Negative.   Cardiovascular: Negative.   Gastrointestinal: Negative.   Genitourinary: Negative.   Musculoskeletal: Negative.   Neurological: Negative.   Psychiatric/Behavioral: Negative.        Objective:   Physical Exam  Nursing note and vitals reviewed. Constitutional: She appears well-developed and well-nourished.  HENT:  Head: Normocephalic and atraumatic.  Right Ear: External ear normal.  Left Ear: External ear normal.  Nose: Nose normal.  Mouth/Throat: Oropharynx is clear and moist.  Eyes: EOM are normal. Pupils are equal, round, and reactive to light.  Neck: Normal range of motion. Neck supple. No thyromegaly present.  Cardiovascular: Normal rate, regular rhythm, normal heart sounds and intact distal pulses.  Exam reveals no gallop and no friction rub.   No murmur heard. Pulmonary/Chest: Effort normal and breath sounds normal.  Abdominal: Soft. Bowel sounds are normal. She exhibits no distension and no mass. There is no tenderness. There is no rebound.  Genitourinary:  Bilateral breast exam normal  Musculoskeletal: Normal range of motion.  Lymphadenopathy:    She has no cervical adenopathy.  Neurological: She is alert. She has normal reflexes. No cranial nerve deficit. She exhibits normal muscle tone. Coordination  normal.  Skin: Skin is warm and dry.  Psychiatric: She has a normal mood and affect. Her behavior is normal. Judgment and thought content normal.          Assessment & Plan:  Healthy female  Perimenopausal with minimal symptoms  Hypothyroidism.......Marland Kitchen refill Synthroid

## 2013-08-30 ENCOUNTER — Ambulatory Visit
Admission: RE | Admit: 2013-08-30 | Discharge: 2013-08-30 | Disposition: A | Discharge status: Home / Self Care | Payer: BC Managed Care – PPO | Source: Ambulatory Visit | Attending: Family Medicine | Admitting: Family Medicine

## 2013-08-30 DIAGNOSIS — N63 Unspecified lump in unspecified breast: Secondary | ICD-10-CM

## 2013-09-01 ENCOUNTER — Other Ambulatory Visit: Payer: Self-pay | Admitting: Family Medicine

## 2013-09-01 DIAGNOSIS — R319 Hematuria, unspecified: Secondary | ICD-10-CM

## 2013-09-15 ENCOUNTER — Other Ambulatory Visit (INDEPENDENT_AMBULATORY_CARE_PROVIDER_SITE_OTHER): Payer: BC Managed Care – PPO

## 2013-09-15 DIAGNOSIS — R319 Hematuria, unspecified: Secondary | ICD-10-CM

## 2013-09-15 LAB — POCT URINALYSIS DIPSTICK
Bilirubin, UA: NEGATIVE
GLUCOSE UA: NEGATIVE
Ketones, UA: NEGATIVE
Leukocytes, UA: NEGATIVE
Nitrite, UA: NEGATIVE
Protein, UA: 0.2
SPEC GRAV UA: 1.01
Urobilinogen, UA: 0.2
pH, UA: 5.5

## 2014-06-08 ENCOUNTER — Encounter: Payer: Self-pay | Admitting: Family Medicine

## 2014-06-08 ENCOUNTER — Ambulatory Visit (INDEPENDENT_AMBULATORY_CARE_PROVIDER_SITE_OTHER): Payer: BLUE CROSS/BLUE SHIELD | Admitting: Family Medicine

## 2014-06-08 VITALS — BP 100/64 | HR 92 | Temp 98.2°F | Wt 99.0 lb

## 2014-06-08 DIAGNOSIS — S0990XA Unspecified injury of head, initial encounter: Secondary | ICD-10-CM | POA: Diagnosis not present

## 2014-06-08 NOTE — Progress Notes (Signed)
   Subjective:    Patient ID: Traci Houston, female    DOB: September 22, 1960, 54 y.o.   MRN: [id number]  HPI Patient seen with headaches. She states that on February 8 she was hanging a picture and lost her balance and fell off a chair she was standing on. She fell backwards and struck occipital scalp. There was no loss of consciousness. She's had almost daily headaches since then. She has tried Tylenol which helps temporarily. She is not using Tylenol regularly. No aspirin use. No confusion. No cognitive changes. No focal neurologic deficits. She has noted some soreness to touch of her parietal-occipital region bilaterally. Denies any prior history of concussion. No exacerbating factors. No associated nausea or vomiting  Also complains of bilateral knee pains especially after squatting or periods of sitting. No effusion. No warmth or erythema.  Past Medical History  Diagnosis Date  . Thyroid disease    No past surgical history on file.  reports that she has never smoked. She does not have any smokeless tobacco history on file. She reports that she does not drink alcohol or use illicit drugs. family history is negative for Colon cancer. No Known Allergies    Review of Systems  Constitutional: Negative for fever and chills.  Respiratory: Negative for cough and shortness of breath.   Cardiovascular: Negative for chest pain.  Neurological: Positive for headaches. Negative for dizziness.  Psychiatric/Behavioral: Negative for confusion.       Objective:   Physical Exam  Constitutional: She is oriented to person, place, and time. She appears well-developed and well-nourished.  HENT:  Head: Normocephalic and atraumatic.  Mouth/Throat: Oropharynx is clear and moist.  Neck: Neck supple.  Cardiovascular: Normal rate and regular rhythm.   Pulmonary/Chest: Effort normal and breath sounds normal. No respiratory distress. She has no wheezes. She has no rales.  Neurological: She is alert and oriented to  person, place, and time. No cranial nerve deficit. Coordination normal.          Assessment & Plan:  Recent closed head trauma. Patient complaining of persistent headaches. She's not had any focal neurologic findings. Given duration of symptoms CT head without contrast to further evaluate. If normal consider low-dose nortriptyline or amitriptyline. Avoid daily use of analgesics.

## 2014-06-08 NOTE — Progress Notes (Signed)
Pre visit review using our clinic review tool, if applicable. No additional management support is needed unless otherwise documented below in the visit note. 

## 2014-06-10 ENCOUNTER — Ambulatory Visit (INDEPENDENT_AMBULATORY_CARE_PROVIDER_SITE_OTHER)
Admission: RE | Admit: 2014-06-10 | Discharge: 2014-06-10 | Disposition: A | Discharge status: Home / Self Care | Payer: BLUE CROSS/BLUE SHIELD | Source: Ambulatory Visit | Attending: Family Medicine | Admitting: Family Medicine

## 2014-06-10 DIAGNOSIS — S0990XA Unspecified injury of head, initial encounter: Secondary | ICD-10-CM | POA: Diagnosis not present

## 2014-07-26 ENCOUNTER — Other Ambulatory Visit: Payer: Self-pay

## 2014-07-26 DIAGNOSIS — Z1231 Encounter for screening mammogram for malignant neoplasm of breast: Secondary | ICD-10-CM

## 2014-09-02 ENCOUNTER — Ambulatory Visit
Admission: RE | Admit: 2014-09-02 | Discharge: 2014-09-02 | Disposition: A | Discharge status: Home / Self Care | Payer: BLUE CROSS/BLUE SHIELD | Source: Ambulatory Visit

## 2014-09-02 DIAGNOSIS — Z1231 Encounter for screening mammogram for malignant neoplasm of breast: Secondary | ICD-10-CM

## 2014-10-11 ENCOUNTER — Other Ambulatory Visit: Payer: Self-pay | Admitting: *Deleted

## 2014-10-11 DIAGNOSIS — E039 Hypothyroidism, unspecified: Secondary | ICD-10-CM

## 2014-10-11 MED ORDER — LEVOTHYROXINE SODIUM 100 MCG PO TABS: 100.0000 ug | ORAL_TABLET | Freq: Every day | ORAL | Status: DC

## 2014-12-27 ENCOUNTER — Ambulatory Visit (INDEPENDENT_AMBULATORY_CARE_PROVIDER_SITE_OTHER): Payer: BLUE CROSS/BLUE SHIELD | Admitting: Family Medicine

## 2014-12-27 ENCOUNTER — Encounter: Payer: Self-pay | Admitting: Family Medicine

## 2014-12-27 VITALS — BP 110/70 | Temp 97.5°F | Ht 60.5 in | Wt 96.0 lb

## 2014-12-27 DIAGNOSIS — E039 Hypothyroidism, unspecified: Secondary | ICD-10-CM | POA: Diagnosis not present

## 2014-12-27 DIAGNOSIS — Z0000 Encounter for general adult medical examination without abnormal findings: Secondary | ICD-10-CM | POA: Diagnosis not present

## 2014-12-27 LAB — TSH: TSH: 0.47 u[IU]/mL (ref 0.35–4.50)

## 2014-12-27 MED ORDER — LEVOTHYROXINE SODIUM 100 MCG PO TABS: 100.0000 ug | ORAL_TABLET | Freq: Every day | ORAL | Status: DC

## 2014-12-27 NOTE — Progress Notes (Signed)
   Subjective:    Patient ID: Traci Houston, female    DOB: June 20, 1960, 54 y.o.   MRN: [id number]  HPI Diskin is a 54 year old married female nonsmoker who comes in today for general physical examination because a history of hypothyroidism  She gets routine eye care, dental care, BSE monthly, annual mammography, last Pap was 2015 normal she's 54 years of age. She's never had any GYN problems.  She works at [employer]  She takes Synthroid 100 g daily because of hypothyroidism. We'll check a TSH level today  She brings in a wellness evaluation that she had done at work. She gets Na+. Height 60.5 inches weight 96 pounds BMI is 18.80. Pressured normal blood sugar lipids etc. all normal therefore not repeated   Review of Systems  Constitutional: Negative.   HENT: Negative.   Eyes: Negative.   Respiratory: Negative.   Cardiovascular: Negative.   Gastrointestinal: Negative.   Endocrine: Negative.   Genitourinary: Negative.   Musculoskeletal: Negative.   Skin: Negative.   Allergic/Immunologic: Negative.   Neurological: Negative.   Hematological: Negative.   Psychiatric/Behavioral: Negative.        Objective:   Physical Exam  Constitutional: She appears well-developed and well-nourished.  HENT:  Head: Normocephalic and atraumatic.  Right Ear: External ear normal.  Left Ear: External ear normal.  Nose: Nose normal.  Mouth/Throat: Oropharynx is clear and moist.  Eyes: EOM are normal. Pupils are equal, round, and reactive to light.  Neck: Normal range of motion. Neck supple. No JVD present. No tracheal deviation present. No thyromegaly present.  Cardiovascular: Normal rate, regular rhythm, normal heart sounds and intact distal pulses.  Exam reveals no gallop and no friction rub.   No murmur heard. Pulmonary/Chest: Effort normal and breath sounds normal. No stridor. No respiratory distress. She has no wheezes. She has no rales. She exhibits no tenderness.  Abdominal: Soft. Bowel sounds  are normal. She exhibits no distension and no mass. There is no tenderness. There is no rebound and no guarding.  Musculoskeletal: Normal range of motion.  Lymphadenopathy:    She has no cervical adenopathy.  Neurological: She is alert. She has normal reflexes. No cranial nerve deficit. She exhibits normal muscle tone. Coordination normal.  Skin: Skin is warm and dry. No rash noted. No erythema. No pallor.  Psychiatric: She has a normal mood and affect. Her behavior is normal. Judgment and thought content normal.          Assessment & Plan:  Healthy female  History of hypothyroidism......... continue Synthroid 100 g daily..... Check TSH level

## 2014-12-27 NOTE — Patient Instructions (Addendum)
Continue current medications  Follow-up in one year sooner if any problems  Labs today........... I will call you if it's abnormal  [facility]...........Marland Kitchen our new adult nurse practitioner from [place].......... when you make your appointment next October........ tell them to make it with him. He will assume your total care  Your also due for a baseline BMI to check your bone density

## 2014-12-27 NOTE — Progress Notes (Signed)
Pre visit review using our clinic review tool, if applicable. No additional management support is needed unless otherwise documented below in the visit note. 

## 2015-01-25 ENCOUNTER — Ambulatory Visit (INDEPENDENT_AMBULATORY_CARE_PROVIDER_SITE_OTHER)
Admission: RE | Admit: 2015-01-25 | Discharge: 2015-01-25 | Disposition: A | Discharge status: Home / Self Care | Payer: BLUE CROSS/BLUE SHIELD | Source: Ambulatory Visit | Attending: Family Medicine | Admitting: Family Medicine

## 2015-01-25 DIAGNOSIS — Z13820 Encounter for screening for osteoporosis: Secondary | ICD-10-CM

## 2015-01-25 DIAGNOSIS — Z681 Body mass index (BMI) 19 or less, adult: Secondary | ICD-10-CM

## 2015-01-25 DIAGNOSIS — Z0000 Encounter for general adult medical examination without abnormal findings: Secondary | ICD-10-CM

## 2015-02-08 ENCOUNTER — Telehealth: Payer: Self-pay | Admitting: *Deleted

## 2015-02-08 NOTE — Telephone Encounter (Signed)
Patient request information about her bone density test.  Information mailed to home address.

## 2015-07-31 ENCOUNTER — Other Ambulatory Visit: Payer: Self-pay

## 2015-07-31 DIAGNOSIS — Z1231 Encounter for screening mammogram for malignant neoplasm of breast: Secondary | ICD-10-CM

## 2015-08-14 ENCOUNTER — Emergency Department (HOSPITAL_COMMUNITY): Payer: BLUE CROSS/BLUE SHIELD

## 2015-08-14 ENCOUNTER — Encounter (HOSPITAL_COMMUNITY): Payer: Self-pay | Admitting: Emergency Medicine

## 2015-08-14 ENCOUNTER — Emergency Department (HOSPITAL_COMMUNITY)
Admission: EM | Admit: 2015-08-14 | Discharge: 2015-08-14 | Disposition: A | Discharge status: Home / Self Care | Payer: BLUE CROSS/BLUE SHIELD | Attending: Emergency Medicine | Admitting: Emergency Medicine

## 2015-08-14 ENCOUNTER — Telehealth: Payer: Self-pay | Admitting: Family Medicine

## 2015-08-14 DIAGNOSIS — I517 Cardiomegaly: Secondary | ICD-10-CM | POA: Diagnosis not present

## 2015-08-14 DIAGNOSIS — R0789 Other chest pain: Secondary | ICD-10-CM | POA: Diagnosis not present

## 2015-08-14 DIAGNOSIS — R079 Chest pain, unspecified: Secondary | ICD-10-CM | POA: Diagnosis not present

## 2015-08-14 LAB — CBC
HEMATOCRIT: 42.4 % (ref 36.0–46.0)
HEMOGLOBIN: 13.8 g/dL (ref 12.0–15.0)
MCH: 27.9 pg (ref 26.0–34.0)
MCHC: 32.5 g/dL (ref 30.0–36.0)
MCV: 85.7 fL (ref 78.0–100.0)
Platelets: 366 10*3/uL (ref 150–400)
RBC: 4.95 MIL/uL (ref 3.87–5.11)
RDW: 13.4 % (ref 11.5–15.5)
WBC: 7.2 10*3/uL (ref 4.0–10.5)

## 2015-08-14 LAB — I-STAT TROPONIN, ED: TROPONIN I, POC: 0 ng/mL (ref 0.00–0.08)

## 2015-08-14 LAB — BASIC METABOLIC PANEL
ANION GAP: 9 (ref 5–15)
BUN: 11 mg/dL (ref 6–20)
CO2: 26 mmol/L (ref 22–32)
Calcium: 9 mg/dL (ref 8.9–10.3)
Chloride: 102 mmol/L (ref 101–111)
Creatinine, Ser: 0.68 mg/dL (ref 0.44–1.00)
GFR calc Af Amer: >60 mL/min (ref >60)
GFR calc non Af Amer: >60 mL/min (ref >60)
GLUCOSE: 128 mg/dL — AB (ref 65–99)
POTASSIUM: 3.7 mmol/L (ref 3.5–5.1)
Sodium: 137 mmol/L (ref 135–145)

## 2015-08-14 NOTE — Telephone Encounter (Signed)
Spoke with pt.  Advised pt that it is very important that she goes to the ED to be seen today.  Pt feels like since the pain has been going on for 2 weeks that it could just wait until tomorrow.  Pt gave excuses about not going, I offered to call EMS to come pick her up if transportation was an issue, Pt refused EMS, but agreed to go to [facility] ED today.

## 2015-08-14 NOTE — ED Notes (Signed)
Patient was placed in a gown

## 2015-08-14 NOTE — Telephone Encounter (Signed)
Nurse Assessment  Nurse: Orvan Seen, RN, Jacquilin Date/Time (Eastern Time): 08/14/2015 10:35:19 AM  Confirm and document reason for call. If symptomatic, describe symptoms. You must click the next button to save text entered. ---Caller states she is having chest pain. Pain started two weeks ago. Pain decreases with OTC medications. No SOB  Has the patient traveled out of the country within the last 30 days? ---No  Does the patient have any new or worsening symptoms? ---Yes  Will a triage be completed? ---Yes  Related visit to physician within the last 2 weeks? ---No  Does the PT have any chronic conditions? (i.e. diabetes, asthma, etc.) ---Yes  List chronic conditions. ---hypothyroid  Is this a behavioral health or substance abuse call? ---No     Guidelines      Guideline Title Affirmed Question Affirmed Notes Nurse Date/Time Eilene Ghazi Time)  Chest Pain [1] Intermittent chest pain or "angina" AND [2] increasing in severity or frequency (Exception: pains lasting a few seconds)  Atkins, RN, Jacquilin 08/14/2015 10:29:28 AM   Disp. Time Eilene Ghazi Time) Disposition Final User   08/14/2015 10:27:14 AM Send to Urgent Queue  Dalia Heading    08/14/2015 10:34:16 AM Go to ED Now Yes Orvan Seen, RN, Jacquilin        Caller Understands: Yes  Disagree/Comply: REFUSED     Care Advice Given Per Guideline    GO TO ED NOW: You need to be seen in the Emergency Department. Go to the ER at ___________ [place] now. Drive carefully. DRIVING: Another adult should drive. CALL EMS IF: * Severe difficulty breathing occurs * Passes out or becomes too weak to stand * You become worse. CARE ADVICE given per Chest Pain (Adult) guideline.     Comments  User: Donnie Aho, RN Date/Time Eilene Ghazi Time): 08/14/2015 10:34:13 AM  Patient states she will go to the ER tomorrow morning.   Referrals  GO TO FACILITY REFUSED

## 2015-08-14 NOTE — ED Provider Notes (Signed)
CSN: [id number]     Arrival date & time 08/14/15  1210 History   First MD Initiated Contact with Patient 08/14/15 1429     Chief Complaint  Patient presents with  . Chest Pain   Patient declined interpreter.  HPI Patient presents with concerns of chest pain 2 weeks. Patient states symptoms have been constant but do resolve with Tylenol. Denies any nausea vomiting and shortness of breath but she does state she is tired. She does have a history of thyroid disease and hasn't told she had hypertension but does not take any medication for it. Denies any history of PE or DVT, hemoptysis, recent travel or immobilization or surgery, calf pain or leg pain or swelling. She denies any palpitations or syncope. Denies any tearing back pain and she does state she has some back pain but it is minimal and constant. Pain is not positional. Pain is not worse with exertion. She has not had any family history of any early heart disease. She is not on any blood thinners or birth control.  Past Medical History  Diagnosis Date  . Thyroid disease    History reviewed. No pertinent past surgical history. Family History  Problem Relation Age of Onset  . Colon cancer Neg Hx    Social History  Substance Use Topics  . Smoking status: Never Smoker   . Smokeless tobacco: None  . Alcohol Use: No   OB History    No data available     Review of Systems  Constitutional: Negative for fever.  Allergic/Immunologic: Negative for immunocompromised state.  All other systems reviewed and are negative.     Allergies  Review of patient's allergies indicates no known allergies.  Home Medications   Prior to Admission medications   Medication Sig Start Date End Date Taking? Authorizing Provider  levothyroxine (SYNTHROID, LEVOTHROID) 100 MCG tablet Take 1 tablet (100 mcg total) by mouth daily. 12/27/14   Dorena Cookey, MD   BP 124/71 mmHg  Pulse 72  Temp(Src) 98 F (36.7 C) (Oral)  Resp 16  SpO2 99%  LMP  08/10/2011 Physical Exam  Constitutional: She appears well-developed and well-nourished. No distress.  HENT:  Head: Normocephalic and atraumatic.  Eyes: Conjunctivae are normal. Right eye exhibits no discharge. Left eye exhibits no discharge.  Neck: Normal range of motion. Neck supple. No JVD present.  Cardiovascular: Normal rate, regular rhythm, normal heart sounds and intact distal pulses.   No murmur heard. Pulmonary/Chest: Effort normal and breath sounds normal. No respiratory distress.  Abdominal: Soft. Bowel sounds are normal. She exhibits no distension and no mass. There is no tenderness. There is no rebound and no guarding.  Musculoskeletal: She exhibits no edema (of LEs).  Neurological: She is alert.  Skin: Skin is warm. No rash noted.  Psychiatric: She has a normal mood and affect.  Nursing note and vitals reviewed.   ED Course  Procedures (including critical care time) Labs Review Labs Reviewed  BASIC METABOLIC PANEL - Abnormal; Notable for the following:    Glucose, Bld 128 (*)    All other components within normal limits  CBC  I-STAT TROPOININ, ED    Imaging Review Dg Chest 2 View  08/14/2015  CLINICAL DATA:  Pain between shoulder blades for 2 weeks. EXAM: CHEST  2 VIEW COMPARISON:  None. FINDINGS: Mild cardiomegaly. The hila and mediastinum are normal. No pneumothorax. No pulmonary nodules, masses, or focal infiltrates. IMPRESSION: Mild cardiomegaly. Electronically Signed   By: Dorise Bullion III M.D  On: 08/14/2015 12:52   I have personally reviewed and evaluated these images and lab results as part of my medical decision-making.   EKG Interpretation   Date/Time:  Monday August 14 2015 12:15:23 EDT Ventricular Rate:  76 PR Interval:  142 QRS Duration: 84 QT Interval:  388 QTC Calculation: 436 R Axis:   -163 Text Interpretation:  Sinus rhythm with marked sinus arrhythmia Right  superior axis deviation No previous tracing Confirmed by Ashok Cordia  MD, Lennette Bihari   [id number]) on 08/14/2015 2:25:24 PM      MDM   Final diagnoses:  Chest pain, unspecified chest pain type    EKG without signs of acute ischemia. Troponin is negative and no need to repeat troponin as heart score is a 3 and patient has had constant chest pain for over 2 weeks. Patient otherwise has has minimal risk factors and exam is unremarkable. Doubt dissection as back pain is non-tearing, minimal, constant and patient had bilateral distal pulses. Denies any infectious symptoms at chest x-ray without evidence of pneumonia. Patient is low risk for PE and his perc rule negative. No further evaluation for PE necessary. Patient is safe for discharge home as doubt an acute emergent condition. She may follow up with her primary care provider for a stress test if deemed necessary but at this time does not require on an emergent basis. Patient discharged in good condition. Return for worsening chest pain, severe shortness of breath, hemoptysis, syncope, or other concerning symptoms.  Karma Greaser, MD 08/14/15 [place], MD 08/23/15 [phone]

## 2015-08-14 NOTE — ED Notes (Signed)
b ack and chest pain x 2 weeks, no n/v states no sob states she has been tired

## 2015-08-24 ENCOUNTER — Telehealth: Payer: Self-pay | Admitting: Family Medicine

## 2015-08-24 ENCOUNTER — Telehealth: Payer: Self-pay | Admitting: General Practice

## 2015-08-24 NOTE — Telephone Encounter (Signed)
I spoke with patient after receiving a triage note from [facility]. [facility] recommended that she go to the ER.  Patient refused.   Patient assured me she is not in distress and that she wants a hospital follow up appointment (Pt went to the ER last week and was told to follow up with PCP).    Appointment scheduled for Monday with Dr. Maudie Mercury.  I instructed patient to call 911 if she feels that she is in distress.  Patient verbalized understanding and thanked me for calling.

## 2015-08-24 NOTE — Telephone Encounter (Signed)
Traci Houston from Triage call to say pt refuse to go to the hospital after complaining of chest pains

## 2015-08-24 NOTE — Telephone Encounter (Signed)
Pt first call asking for check up appt. Pt was told dr Sherren Mocha is out of office until sept and next check up appt will be in dec 2017. Pt then stated she is having chest pains and I sent the call to [facility]. Pt call back again and I ask her what did [facility] said to her she stated nothing then she said something about going to ER . Pt was seen at er on 08-14-15.

## 2015-08-24 NOTE — Telephone Encounter (Signed)
Patient Name: Traci Houston DOB: 02-Jan-1961 Initial Comment Caller states her chest is tighter, heart beating fast. Nurse Assessment Nurse: Vallery Sa, RN, Cathy Date/Time (Eastern Time): 08/24/2015 1:04:47 PM Confirm and document reason for call. If symptomatic, describe symptoms. You must click the next button to save text entered. ---Caller states she developed chest tightness about 3 weeks ago that is present today (rated as a 5 on the 1 to 10 scale) and her heart rate increased again this morning. No severe breathing difficulty. Alert and responsive. Has the patient traveled out of the country within the last 30 days? ---No Does the patient have any new or worsening symptoms? ---Yes Will a triage be completed? ---Yes Related visit to physician within the last 2 weeks? ---No Does the PT have any chronic conditions? (i.e. diabetes, asthma, etc.) ---Yes List chronic conditions. ---Elbow pain, Thyroid problems Is this a behavioral health or substance abuse call? ---No Guidelines Guideline Title Affirmed Question Affirmed Notes Chest Pain [1] Intermittent chest pain or "angina" AND [2] increasing in severity or frequency (Exception: pains lasting a few seconds) Final Disposition User Go to ED Now Vallery Sa, RN, [place] Comments [place] Interpreter [phone] Caller declined the Go to ER disposition because she went to the ER two weeks ago and was released with direction to follow up with her MD. Hulen Skains the office backline and notified Malachy Mood. Referrals GO TO FACILITY REFUSED Disagree/Comply: Disagree Disagree/Comply Reason: Disagree with instructions

## 2015-08-28 ENCOUNTER — Ambulatory Visit (INDEPENDENT_AMBULATORY_CARE_PROVIDER_SITE_OTHER): Payer: BLUE CROSS/BLUE SHIELD | Admitting: Family Medicine

## 2015-08-28 ENCOUNTER — Encounter: Payer: Self-pay | Admitting: Family Medicine

## 2015-08-28 VITALS — BP 110/76 | HR 88 | Temp 97.9°F | Ht 60.5 in | Wt 86.6 lb

## 2015-08-28 DIAGNOSIS — E039 Hypothyroidism, unspecified: Secondary | ICD-10-CM | POA: Diagnosis not present

## 2015-08-28 DIAGNOSIS — R002 Palpitations: Secondary | ICD-10-CM | POA: Diagnosis not present

## 2015-08-28 DIAGNOSIS — R011 Cardiac murmur, unspecified: Secondary | ICD-10-CM | POA: Diagnosis not present

## 2015-08-28 LAB — TSH: TSH: 0.45 u[IU]/mL (ref 0.35–4.50)

## 2015-08-28 NOTE — Patient Instructions (Addendum)
BEFORE YOU LEAVE: -lab to check your thyroid test  -We placed a referral for you as discussed for several heart studies and to the cardiologist per your request. It usually takes about 1-2 weeks to process and schedule this referral. If you have not heard from [place] regarding this appointment in 2 weeks please contact our office.  -in the interim please seek care immediately if symptoms are worsening or you have new concerns.  -please cut down on any products containing caffeine, including tea

## 2015-08-28 NOTE — Progress Notes (Signed)
HPI:  Traci Houston is a 55 yo, patient of Dr. Sherren Mocha, here for follow up regarding palitations. She reports this started about 3-4 weeks ago. Evaluated in hospital 08/14/15 with EKG, troponins, labs and CXR. Emergency room notes suggest she had chest pain, however she denies ever having any pain. She reports several times a day she feels that her heart is racing. The first time this occurred, one of her friends told her it might be a heart attack. Since then she has been anxious about her heart. Whenever she feels the flutter in her chest she becomes very anxious. The spells usually last for several minutes to 30 minutes per her report. She denies any accompanying shortness of breath, dyspnea on exertion, chest pain, nausea, vomiting or jaw pain. Occasionally she has some pain in her upper trapezius muscles bilaterally. She denies syncope, swelling in her legs, dizziness, fevers or headaches. She does report she has felt a little more tired than usual. She thinks this is from the anxiety about her heart. She wants to see a cardiologist. She has hypothyroidism, and has not had labs in some time. She did have a CBC and a basic metabolic panel at her emergency room visit. She drinks tea.  ROS: See pertinent positives and negatives per HPI.  Past Medical History  Diagnosis Date  . Thyroid disease     No past surgical history on file.  Family History  Problem Relation Age of Onset  . Colon cancer Neg Hx     Social History   Social History  . Marital Status: Married    Spouse Name: N/A  . Number of Children: N/A  . Years of Education: N/A   Social History Main Topics  . Smoking status: Never Smoker   . Smokeless tobacco: None  . Alcohol Use: No  . Drug Use: No  . Sexual Activity: Not Asked   Other Topics Concern  . None   Social History Narrative     Current outpatient prescriptions:  .  levothyroxine (SYNTHROID, LEVOTHROID) 100 MCG tablet, Take 1 tablet (100 mcg total) by mouth  daily., Disp: 100 tablet, Rfl: 3  EXAM:  Filed Vitals:   08/28/15 0926  BP: 110/76  Pulse: 88  Temp: 97.9 F (36.6 C)    Body mass index is 16.63 kg/(m^2).  GENERAL: vitals reviewed and listed above, alert, oriented, appears well hydrated and in no acute distress  HEENT: atraumatic, conjunttiva clear, no obvious abnormalities on inspection of external nose and ears  NECK: no obvious masses on inspection  LUNGS: clear to auscultation bilaterally, no wheezes, rales or rhonchi, good air movement  CV: HRRR, II/VI holosystolic murmur, no peripheral edema  MS: moves all extremities without noticeable abnormality  PSYCH: pleasant and cooperative, no obvious depression or anxiety  ASSESSMENT AND PLAN:  Discussed the following assessment and plan:  Palpitations - Plan: Ambulatory referral to Cardiology, Holter monitor - 24 hour, ECHO COMPLETE  Heart murmur - Plan: ECHO COMPLETE  Hypothyroidism, unspecified hypothyroidism type - Plan: TSH  -we discussed possible serious and likely etiologies, workup and treatment, treatment risks and return precautions -after this discussion, Haya opted for seeing a cardiologist; offered cardiac testing (holter monitor, and echo)and she agreed to this and a thyroid check, but still wants to see the cardiologist as well as is quite anxious about this -of course, we advised Terricka  to return or notify a doctor immediately if symptoms worsen or persist or new concerns arise.   -Patient advised  to return or notify a doctor immediately if symptoms worsen or persist or new concerns arise.  Patient Instructions  BEFORE YOU LEAVE: -lab to check your thyroid test  -We placed a referral for you as discussed for several heart studies and to the cardiologist per your request. It usually takes about 1-2 weeks to process and schedule this referral. If you have not heard from [place] regarding this appointment in 2 weeks please contact our office.  -in the  interim please seek care immediately if symptoms are worsening or you have new concerns.  -please cut down on any products containing caffeine, including tea      Ahmir Bracken R.

## 2015-08-28 NOTE — Progress Notes (Signed)
Pre visit review using our clinic review tool, if applicable. No additional management support is needed unless otherwise documented below in the visit note. 

## 2015-08-29 ENCOUNTER — Telehealth: Payer: Self-pay | Admitting: Family Medicine

## 2015-08-29 NOTE — Telephone Encounter (Addendum)
Pt is returning Traci Houston  call. Pt was seen yesterday

## 2015-08-29 NOTE — Telephone Encounter (Signed)
I called the pt and informed her of the results. 

## 2015-08-31 ENCOUNTER — Telehealth: Payer: Self-pay | Admitting: Cardiology

## 2015-08-31 NOTE — Telephone Encounter (Signed)
Ne message      The pt thinks someone from this office called her yesterday, tried to explain I did not see where anyone called the pt states someone called her yesterday evening saying a monitor was ready. Pt uncertain of what they needed.

## 2015-09-04 ENCOUNTER — Ambulatory Visit
Admission: RE | Admit: 2015-09-04 | Discharge: 2015-09-04 | Disposition: A | Discharge status: Home / Self Care | Payer: BLUE CROSS/BLUE SHIELD | Source: Ambulatory Visit

## 2015-09-04 DIAGNOSIS — Z1231 Encounter for screening mammogram for malignant neoplasm of breast: Secondary | ICD-10-CM | POA: Diagnosis not present

## 2015-09-05 ENCOUNTER — Encounter: Payer: Self-pay | Admitting: Cardiology

## 2015-09-08 ENCOUNTER — Ambulatory Visit (INDEPENDENT_AMBULATORY_CARE_PROVIDER_SITE_OTHER): Payer: BLUE CROSS/BLUE SHIELD

## 2015-09-08 ENCOUNTER — Other Ambulatory Visit: Payer: Self-pay

## 2015-09-08 ENCOUNTER — Encounter: Payer: Self-pay | Admitting: Family Medicine

## 2015-09-08 ENCOUNTER — Ambulatory Visit (HOSPITAL_COMMUNITY): Payer: BLUE CROSS/BLUE SHIELD | Attending: Cardiology

## 2015-09-08 ENCOUNTER — Encounter (INDEPENDENT_AMBULATORY_CARE_PROVIDER_SITE_OTHER): Payer: Self-pay

## 2015-09-08 DIAGNOSIS — Z8249 Family history of ischemic heart disease and other diseases of the circulatory system: Secondary | ICD-10-CM | POA: Insufficient documentation

## 2015-09-08 DIAGNOSIS — R002 Palpitations: Secondary | ICD-10-CM | POA: Insufficient documentation

## 2015-09-08 DIAGNOSIS — I5189 Other ill-defined heart diseases: Secondary | ICD-10-CM | POA: Diagnosis not present

## 2015-09-08 DIAGNOSIS — R011 Cardiac murmur, unspecified: Secondary | ICD-10-CM | POA: Insufficient documentation

## 2015-09-08 DIAGNOSIS — I351 Nonrheumatic aortic (valve) insufficiency: Secondary | ICD-10-CM | POA: Diagnosis not present

## 2015-09-08 DIAGNOSIS — I071 Rheumatic tricuspid insufficiency: Secondary | ICD-10-CM | POA: Diagnosis not present

## 2015-09-08 DIAGNOSIS — I340 Nonrheumatic mitral (valve) insufficiency: Secondary | ICD-10-CM | POA: Insufficient documentation

## 2015-09-08 DIAGNOSIS — I5030 Unspecified diastolic (congestive) heart failure: Secondary | ICD-10-CM | POA: Insufficient documentation

## 2015-09-08 HISTORY — DX: Cardiac murmur, unspecified: R01.1

## 2015-09-08 HISTORY — DX: Unspecified diastolic (congestive) heart failure: I50.30

## 2015-09-08 LAB — ECHOCARDIOGRAM COMPLETE
CHL CUP RV SYS PRESS: 24 mmHg
E decel time: 173 msec
EERAT: 8.95
FS: 44 % (ref 28–44)
IV/PV OW: 0.81
LA ID, A-P, ES: 28 mm
LA diam end sys: 28 mm
LA diam index: 2.18 cm/m2
LA vol index: 18.7 mL/m2
LA vol: 24 mL
LAVOLA4C: 23 mL
LV E/e'average: 8.95
LV TDI E'MEDIAL: 7.46
LV e' LATERAL: 8.66 cm/s
LVEEMED: 8.95
LVOT SV: 36 mL
LVOT VTI: 14 cm
LVOT area: 2.54 cm2
LVOT diameter: 18 mm
LVOT peak vel: 75.2 cm/s
MV Dec: 173
MV Peak grad: 2 mmHg
MV pk E vel: 77.5 m/s
MVPKAVEL: 70.1 m/s
PW: 8.91 mm — AB (ref 0.6–1.1)
Reg peak vel: 229 cm/s
TDI e' lateral: 8.66
TR max vel: 229 cm/s

## 2015-09-13 ENCOUNTER — Telehealth: Payer: Self-pay | Admitting: Family Medicine

## 2015-09-13 NOTE — Telephone Encounter (Signed)
Pt would like to know if you have gotten the results from her visit to the Cardiology.

## 2015-09-13 NOTE — Telephone Encounter (Signed)
Left message on voicemail to call office.  

## 2015-09-14 NOTE — Telephone Encounter (Signed)
See results note. 

## 2015-09-18 NOTE — Progress Notes (Signed)
Electrophysiology Office Note   Date:  09/19/2015   ID:  SHERA WHITTINGHILL, DOB 1960/06/15, MRN [id number]  PCP:  Joycelyn Man, MD  Primary Electrophysiologist:  Constance Haw, MD    Chief Complaint  Patient presents with  . Palpitations     History of Present Illness: Traci Houston is a 55 y.o. female who presents today for electrophysiology evaluation.   She has been having palpitations for the last 4-5 weeks. Evaluated in hospital 08/14/15 with EKG, troponins, labs and CXR. She reports several times a day she feels that her heart is racing. Whenever she feels the flutter in her chest she becomes very anxious. The spells usually last for several minutes to 30 minutes per her report. She denies syncope, swelling in her legs, dizziness, fevers or headaches. She does report she has felt a little more tired than usual. She says that when she wore her Holter monitor, she was feeling well and did not have many symptoms. Since then she has continued to have symptoms of palpitations and fatigue. She says that her symptoms occur every day for most of the day. She can rest and that makes her symptoms a little bit better. Otherwise there are no exacerbating or alleviating factors.  Today, she denies symptoms of palpitations, chest pain, shortness of breath, orthopnea, PND, lower extremity edema, claudication, dizziness, presyncope, syncope, bleeding, or neurologic sequela. The patient is tolerating medications without difficulties and is otherwise without complaint today.    Past Medical History  Diagnosis Date  . Thyroid disease   . Diastolic CHF ([place]) [id number]    -mild on echo 2017 for murmur   . Heart murmur 09/08/2015    Echo 2017: -Normal LV systolic function; grade 1 diastolic dysfunction; trace AI; mild MR and TR.    History reviewed. No pertinent past surgical history.   Current Outpatient Prescriptions  Medication Sig Dispense Refill  . levothyroxine (SYNTHROID, LEVOTHROID) 100  MCG tablet Take 1 tablet (100 mcg total) by mouth daily. 100 tablet 3   No current facility-administered medications for this visit.    Allergies:   Review of patient's allergies indicates no known allergies.   Social History:  The patient  reports that she has never smoked. She does not have any smokeless tobacco history on file. She reports that she does not drink alcohol or use illicit drugs.   Family History:  The patient's family history includes Heart disease in her sister. There is no history of Colon cancer.    ROS:  Please see the history of present illness.   Otherwise, review of systems is positive for palpitatins.   All other systems are reviewed and negative.    PHYSICAL EXAM: VS:  BP 100/62 mmHg  Pulse 90  Ht 5\' 1"  (1.549 m)  Wt 97 lb 6.4 oz (44.18 kg)  BMI 18.41 kg/m2  LMP 08/10/2011 , BMI Body mass index is 18.41 kg/(m^2). GEN: Well nourished, well developed, in no acute distress HEENT: normal Neck: no JVD, carotid bruits, or masses Cardiac: RRR; no murmurs, rubs, or gallops,no edema  Respiratory:  clear to auscultation bilaterally, normal work of breathing GI: soft, nontender, nondistended, + BS MS: no deformity or atrophy Skin: warm and dry Neuro:  Strength and sensation are intact Psych: euthymic mood, full affect  EKG:  EKG is ordered today. The ekg ordered today shows sinus rhythm, rate 91  Recent Labs: 08/14/2015: BUN 11; Creatinine, Ser 0.68; Hemoglobin 13.8; Platelets 366; Potassium 3.7; Sodium 137 08/28/2015:  TSH 0.45    Lipid Panel     Component Value Date/Time   CHOL 181 07/10/2011 1257   TRIG 153.0* 07/10/2011 1257   HDL 56.80 07/10/2011 1257   CHOLHDL 3 07/10/2011 1257   VLDL 30.6 07/10/2011 1257   LDLCALC 94 07/10/2011 1257     Wt Readings from Last 3 Encounters:  09/19/15 97 lb 6.4 oz (44.18 kg)  08/28/15 86 lb 9.6 oz (39.282 kg)  12/27/14 96 lb (43.545 kg)      Other studies Reviewed: Additional studies/ records that were  reviewed today include: TTE 09/08/15  - Normal LV systolic function; grade 1 diastolic dysfunction; trace  AI; mild MR and TR.  Holter 09/08/15 1. NSR 2. Sinus tachycardia 3. Noise artifact 4. No SVT, VT, or pauses   ASSESSMENT AND PLAN:  1.  Palpitations:  Holter monitor showed no evidence of either tachycardia or bradycardia. She does say that she was not having any symptoms while wearing the monitor. Due to that, and her continued symptoms, we'll place a 30 day monitor. She does say that her symptoms occur most days, and hopefully we Traci Houston be able to catch some sort of arrhythmia. It is also possible that she does not have any arrhythmia as seen on the Holter  Current medicines are reviewed at length with the patient today.   The patient does not have concerns regarding her medicines.  The following changes were made today:  none  Labs/ tests ordered today include:  Orders Placed This Encounter  Procedures  . Cardiac event monitor  . EKG 12-Lead     Disposition:   FU with Traci Houston post monitor  Signed, Charnelle Bergeman Meredith Leeds, MD  09/19/2015 8:13 AM     [facility] [address] [place] [address] [phone] (office) [phone] (fax)

## 2015-09-19 ENCOUNTER — Ambulatory Visit (INDEPENDENT_AMBULATORY_CARE_PROVIDER_SITE_OTHER): Payer: BLUE CROSS/BLUE SHIELD

## 2015-09-19 ENCOUNTER — Ambulatory Visit (INDEPENDENT_AMBULATORY_CARE_PROVIDER_SITE_OTHER): Payer: BLUE CROSS/BLUE SHIELD | Admitting: Cardiology

## 2015-09-19 ENCOUNTER — Encounter: Payer: Self-pay | Admitting: Cardiology

## 2015-09-19 ENCOUNTER — Encounter (INDEPENDENT_AMBULATORY_CARE_PROVIDER_SITE_OTHER): Payer: Self-pay

## 2015-09-19 VITALS — BP 100/62 | HR 90 | Ht 61.0 in | Wt 97.4 lb

## 2015-09-19 DIAGNOSIS — R002 Palpitations: Secondary | ICD-10-CM

## 2015-09-19 NOTE — Patient Instructions (Signed)
Medication Instructions:  Your physician recommends that you continue on your current medications as directed. Please refer to the Current Medication list given to you today.  Labwork: None ordered  Testing/Procedures: Your physician has recommended that you wear an event monitor. Event monitors are medical devices that record the heart's electrical activity. Doctors most often [place] these monitors to diagnose arrhythmias. Arrhythmias are problems with the speed or rhythm of the heartbeat. The monitor is a small, portable device. You can wear one while you do your normal daily activities. This is usually used to diagnose what is causing palpitations/syncope (passing out).  Follow-Up: To be determined once physician has reviewed monitor results. We will call you with these results.  Thank you for choosing [facility]!!   Trinidad Curet, RN [phone]

## 2016-03-18 ENCOUNTER — Other Ambulatory Visit (HOSPITAL_COMMUNITY)
Admission: RE | Admit: 2016-03-18 | Discharge: 2016-03-18 | Disposition: A | Discharge status: Home / Self Care | Payer: BLUE CROSS/BLUE SHIELD | Source: Ambulatory Visit | Attending: Family Medicine | Admitting: Family Medicine

## 2016-03-18 ENCOUNTER — Encounter: Payer: Self-pay | Admitting: Family Medicine

## 2016-03-18 ENCOUNTER — Ambulatory Visit (INDEPENDENT_AMBULATORY_CARE_PROVIDER_SITE_OTHER): Payer: BLUE CROSS/BLUE SHIELD | Admitting: Family Medicine

## 2016-03-18 VITALS — BP 92/60 | HR 89 | Temp 97.3°F | Ht 61.0 in | Wt 97.9 lb

## 2016-03-18 DIAGNOSIS — E039 Hypothyroidism, unspecified: Secondary | ICD-10-CM | POA: Diagnosis not present

## 2016-03-18 DIAGNOSIS — I341 Nonrheumatic mitral (valve) prolapse: Secondary | ICD-10-CM | POA: Diagnosis not present

## 2016-03-18 DIAGNOSIS — Z0000 Encounter for general adult medical examination without abnormal findings: Secondary | ICD-10-CM

## 2016-03-18 DIAGNOSIS — Z01419 Encounter for gynecological examination (general) (routine) without abnormal findings: Secondary | ICD-10-CM | POA: Diagnosis not present

## 2016-03-18 DIAGNOSIS — Z124 Encounter for screening for malignant neoplasm of cervix: Secondary | ICD-10-CM

## 2016-03-18 DIAGNOSIS — Z23 Encounter for immunization: Secondary | ICD-10-CM

## 2016-03-18 MED ORDER — LEVOTHYROXINE SODIUM 100 MCG PO TABS: 100.0000 ug | ORAL_TABLET | Freq: Every day | ORAL | 4 refills | Status: DC

## 2016-03-18 NOTE — Patient Instructions (Signed)
Continue current medication.............. thyroid 1 tablet daily  Avoid all caffeinated beverages....... these will cause palpitations and rapid heart rate.  Your cardiac exam was normal except for a slight heart murmur which is not clinically significant and will not harm you. Follow-up in one year sooner if any problems.

## 2016-03-18 NOTE — Progress Notes (Signed)
Traci Houston  is a 56 year old married female nonsmoker who comes in today for general physical examination because of a history of hypothyroidism  She gets routine eye care, dental care, BSE monthly, annual mammography, colonoscopy 2013 was normal.  Facet vaccinations seasonal flu shot given at work in October 2017. Tetanus booster today. Last tetanus booster was April 2007.  She works at [employer]. She walks occasionally.  LMP was around age 13 she's never had trouble with her Pap smears.  14 point review of systems reviewed and otherwise negative  Physical evaluation.vs BP 92/60 (BP Location: Left Arm, Patient Position: Sitting, Cuff Size: Normal)   Pulse 89   Temp 97.3 F (36.3 C) (Oral)   Ht 5\' 1"  (1.549 m)   Wt 97 lb 14.4 oz (44.4 kg)   LMP 08/10/2011   BMI 18.50 kg/m    Examination of the HEENT were negative neck was supple no adenopathy thyroid normal. Cardiopulmonary exam normal., Except for grade 2/6 holosystolic murmur. This is consistent with mitral valve prolapse. This was documented by echo last year by Dr. Ninfa Meeker. Abdominal exam normal pelvic examination external genitalia within normal limits vaginal vault was normal cervix is visualized Pap smear was done bimanual exam negative rectal normal stool guaiac negative extremities normal skin normal peripheral pulses normal  Impression #1 hypothyroidism.....Marland Kitchen continue Synthroid check labs  #2 mitral valve prolapse....... asymptomatic

## 2016-03-20 LAB — CYTOLOGY - PAP: Diagnosis: NEGATIVE

## 2016-04-01 ENCOUNTER — Telehealth: Payer: Self-pay | Admitting: Family Medicine

## 2016-04-01 MED ORDER — LEVOTHYROXINE SODIUM 100 MCG PO TABS: 100.0000 ug | ORAL_TABLET | Freq: Every day | ORAL | 4 refills | Status: DC

## 2016-04-01 NOTE — Telephone Encounter (Signed)
Pt need a new rx levothyroxine 100 mcg #30 w/refills [facility]

## 2016-04-01 NOTE — Telephone Encounter (Signed)
Refill sent in

## 2016-04-08 ENCOUNTER — Telehealth: Payer: Self-pay | Admitting: Family Medicine

## 2016-04-08 MED ORDER — LEVOTHYROXINE SODIUM 100 MCG PO TABS: 100.0000 ug | ORAL_TABLET | Freq: Every day | ORAL | 4 refills | Status: DC

## 2016-04-08 NOTE — Telephone Encounter (Signed)
[facility]ill sent in to [facility].

## 2016-04-08 NOTE — Telephone Encounter (Signed)
Pt 's rx needs to be sent to [facility] Please resencd  asap. Thanks levothyroxine (SYNTHROID, LEVOTHROID) 100 MCG tablet  (Not [facility])

## 2016-07-02 DIAGNOSIS — I517 Cardiomegaly: Secondary | ICD-10-CM | POA: Diagnosis not present

## 2016-07-04 ENCOUNTER — Ambulatory Visit (INDEPENDENT_AMBULATORY_CARE_PROVIDER_SITE_OTHER): Payer: BLUE CROSS/BLUE SHIELD | Admitting: Adult Health

## 2016-07-04 VITALS — BP 98/58 | Temp 97.7°F | Wt 97.4 lb

## 2016-07-04 DIAGNOSIS — R002 Palpitations: Secondary | ICD-10-CM | POA: Diagnosis not present

## 2016-07-04 MED ORDER — CITALOPRAM HYDROBROMIDE 10 MG PO TABS: 10.0000 mg | ORAL_TABLET | Freq: Every day | ORAL | 3 refills | Status: DC

## 2016-07-04 NOTE — Progress Notes (Signed)
Subjective:    Patient ID: Traci Houston, female    DOB: 28-Jun-1960, 56 y.o.   MRN: [id number]  HPI  56 year old female who  has a past medical history of Diastolic CHF ([place]) (08/30/2977); Heart murmur (09/08/2015); and Thyroid disease. She is a patient of Dr. Sherren Mocha who I am seeing today for the first time for an urgent care visit related to chest pain and SOB. She had a chest x ray done which showed " Borderline cardiomegaly" .   She reports in the office today that she is experiencing more frequent palpitations. Per patient " they have been happening almost every day and it is more so when I wake up in the morning and into the afternoon. She denies any palpitations in the evening.   She was worked up for this last June   Echo was normal with an EF of 55 to 60%  Holter monitor was normal as was 30 day even monitor   She does report anxiety    Review of Systems  Constitutional: Negative.   Respiratory: Negative.   Cardiovascular: Positive for palpitations. Negative for chest pain and leg swelling.  Gastrointestinal: Negative.   Neurological: Negative.  Negative for dizziness.  Psychiatric/Behavioral: The patient is nervous/anxious.   All other systems reviewed and are negative.  See HPI   Past Medical History:  Diagnosis Date  . Diastolic CHF ([place]) 8/92/1194   -mild on echo 2017 for murmur   . Heart murmur 09/08/2015   Echo 2017: -Normal LV systolic function; grade 1 diastolic dysfunction; trace AI; mild MR and TR.   Marland Kitchen Thyroid disease     Social History   Social History  . Marital status: Married    Spouse name: N/A  . Number of children: N/A  . Years of education: N/A   Occupational History  . Not on file.   Social History Main Topics  . Smoking status: Never Smoker  . Smokeless tobacco: Never Used  . Alcohol use No  . Drug use: No  . Sexual activity: Not on file   Other Topics Concern  . Not on file   Social History Narrative  . No narrative on file     No past surgical history on file.  Family History  Problem Relation Age of Onset  . Heart disease Sister   . Colon cancer Neg Hx     No Known Allergies  Current Outpatient Prescriptions on File Prior to Visit  Medication Sig Dispense Refill  . levothyroxine (SYNTHROID, LEVOTHROID) 100 MCG tablet Take 1 tablet (100 mcg total) by mouth daily. 100 tablet 4   No current facility-administered medications on file prior to visit.     BP (!) 98/58 (BP Location: Left Arm, Patient Position: Sitting, Cuff Size: Normal)   Temp 97.7 F (36.5 C) (Oral)   Wt 97 lb 6.4 oz (44.2 kg)   LMP 08/10/2011   BMI 18.40 kg/m       Objective:   Physical Exam  Constitutional: She is oriented to person, place, and time. She appears well-developed and well-nourished. No distress.  Cardiovascular: Normal rate, regular rhythm, normal heart sounds and intact distal pulses.  Exam reveals no gallop and no friction rub.   No murmur heard. Pulmonary/Chest: Effort normal and breath sounds normal. No respiratory distress. She has no wheezes. She has no rales. She exhibits no tenderness.  Musculoskeletal: Normal range of motion. She exhibits no edema, tenderness or deformity.  Neurological: She is alert and  oriented to person, place, and time.  Skin: Skin is warm and dry. No rash noted. She is not diaphoretic. No erythema. No pallor.  Psychiatric: She has a normal mood and affect. Her behavior is normal. Judgment and thought content normal.  Nursing note and vitals reviewed.     Assessment & Plan:  1. Palpitations - Cardiac workup normal in the past. I do not see it reasonable to repeat these exams at this time. I am wondering is palpitations are anxiety related. I am going to have her try Celexa 10 mg and see if this helps overall.  - citalopram (CELEXA) 10 MG tablet; Take 1 tablet (10 mg total) by mouth daily.  Dispense: 30 tablet; Refill: 3 - Follow up in one month or sooner if needed  Dorothyann Peng,  NP

## 2016-07-04 NOTE — Patient Instructions (Addendum)
  I have prescribed a medication called Celexa, this is for anxiety and I think your palpitations are coming from this. Take this medication every morning   If needed, you can call your cardiologist Dr. Ulyses Amor at   [place] [facility] [place] [address], [place], [place] [address] Main: [phone]   Please follow up with me in one month

## 2016-07-08 ENCOUNTER — Telehealth: Payer: Self-pay | Admitting: Cardiology

## 2016-07-08 NOTE — Progress Notes (Signed)
Cardiology Office Note    Date:  07/09/2016   ID:  [name], DOB 01-16-1961, MRN [id number]  PCP:  Joycelyn Man, MD  Cardiologist:  Dr. Curt Bears  CC: palpitations  History of Present Illness:  Traci Houston is a 56 y.o. female with a history of hypothyroidism and subjective palpitations who presents to clinic for evaluation of palpitations.    She was was evaluated in the [facility] ED in 08/2015. EKG, troponins, labs and CXR were all normal. 2D ECHO 09/07/16 showed EF 55-60%, G1DD, mild MR. She was seen by Dr. Curt Bears in the office for follow up and 48 hour holter was unremarkable. She felt that we had missed her symptoms and so 30 day event monitor was ordered. This showed that her palpitations correlated to sinus rhythm and sinus tachycardia.   She saw PCP yesterday for recurrent palpitations and there was some concern for anxiety so she was started on Celexa.   Today she presents to clinic for follow up. She wakes up in the morning with sharp chest pain that radiates to her back and jaw. It is associated with palpitations. It usually happens all day intermittently and has been ongoing for the past couple weeks almost every day. No LE edema, orthopnea or PND. She does exercise 4-5 x a week by walking for about 10 minutes. She does not get the symptoms with exercise. It usually occurs at rest or when she is in bed. No dizziness or syncope. She denies any stress in her life. 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 She did not continue to take the Celexa because it didn't help her sx and made her tired.   Her sister had heart problems but she is not sure what but knows that it was fixed with heart surgery of some sort.    Past Medical History:  Diagnosis Date  . Diastolic CHF ([place]) 04/06/5168   -mild on echo 2017 for murmur   . Heart murmur 09/08/2015   Echo 2017: -Normal LV systolic function; grade 1 diastolic dysfunction; trace AI; mild MR and TR.   Marland Kitchen Thyroid disease      History reviewed. No pertinent surgical history.  Current Medications: Outpatient Medications Prior to Visit  Medication Sig Dispense Refill  . levothyroxine (SYNTHROID, LEVOTHROID) 100 MCG tablet Take 1 tablet (100 mcg total) by mouth daily. 100 tablet 4  . citalopram (CELEXA) 10 MG tablet Take 1 tablet (10 mg total) by mouth daily. 30 tablet 3   No facility-administered medications prior to visit.      Allergies:   Patient has no known allergies.   Social History   Social History  . Marital status: Married    Spouse name: N/A  . Number of children: N/A  . Years of education: N/A   Social History Main Topics  . Smoking status: Never Smoker  . Smokeless tobacco: Never Used  . Alcohol use No  . Drug use: No  . Sexual activity: Not Asked   Other Topics Concern  . None   Social History Narrative  . None     Family History:  The patient's family history includes Heart disease in her sister.      ROS:   Please see the history of present illness.    ROS All other systems reviewed and are negative.   PHYSICAL EXAM:   VS:  BP 96/62   Pulse 90   Ht 5\' 1"  (1.549 m)   Wt  98 lb 6.4 oz (44.6 kg)   LMP 08/10/2011   SpO2 99%   BMI 18.59 kg/m    GEN: Well nourished, well developed, in no acute distress  HEENT: normal  Neck: no JVD, carotid bruits, or masses Cardiac: RRR; no murmurs, rubs, or gallops,no edema  Respiratory:  clear to auscultation bilaterally, normal work of breathing GI: soft, nontender, nondistended, + BS MS: no deformity or atrophy  Skin: warm and dry, no rash Neuro:  Alert and Oriented x 3, Strength and sensation are intact Psych: euthymic mood, full affect     Wt Readings from Last 3 Encounters:  07/09/16 98 lb 6.4 oz (44.6 kg)  07/04/16 97 lb 6.4 oz (44.2 kg)  03/18/16 97 lb 14.4 oz (44.4 kg)      Studies/Labs Reviewed:   EKG:  EKG is ordered today.  The ekg ordered today demonstrates NSR HR 77  Recent Labs: 08/14/2015: BUN 11;  Creatinine, Ser 0.68; Hemoglobin 13.8; Platelets 366; Potassium 3.7; Sodium 137 08/28/2015: TSH 0.45   Lipid Panel    Component Value Date/Time   CHOL 181 07/10/2011 1257   TRIG 153.0 (H) 07/10/2011 1257   HDL 56.80 07/10/2011 1257   CHOLHDL 3 07/10/2011 1257   VLDL 30.6 07/10/2011 1257   LDLCALC 94 07/10/2011 1257    Additional studies/ records that were reviewed today include:  TTE 09/08/15  - Normal LV systolic function; grade 1 diastolic dysfunction; trace  AI; mild MR and TR.  Holter 09/08/15 1. NSR 2. Sinus tachycardia 3. Noise artifact 4. No SVT, VT, or pauses  30 day event monitor 10/2015 Study Highlights  The initial Baseline transmission shows SINUS RHYTHM The high average heart rate seen for the monitored period was 110 BPM. The low average heart rate seen for the monitored period was 80 BPM. No Pauses noted for 3 seconds or longer. Symptoms of palpitations and fluttering associated with sinus rhythm and sinus tachycardia.     ASSESSMENT & PLAN:   Subjective palpitations: no arrhythmias noted on two different monitors. 30 day event monitor showed that when she had the symptoms that she was in sinus tach and NSR. Will try small dose of Lopressor 12.5mg  BID to see if this helps.   Chest pain and dyspnea on exertion: will get GXT although her symptoms are not typical of angina. She has no RFs for CAD. She will hold Lopressor on day of stress test.   Hypothyroidism: TSH followed by PCP. Continue synthroid   Medication Adjustments/Labs and Tests Ordered: Current medicines are reviewed at length with the patient today.  Concerns regarding medicines are outlined above.  Medication changes, Labs and Tests ordered today are listed in the Patient Instructions below. Patient Instructions  Medication Instructions:  Your physician has recommended you make the following change in your medication:  1.  START Lopressor 25 mg taking 1/2 tablet twice a day  Labwork: None  ordered  Testing/Procedures: Your physician has requested that you have an exercise tolerance test. For further information please visit [email or web address]. Please also follow instruction sheet, as given.   Follow-Up: Your physician recommends that you schedule a follow-up appointment in: 3 MONTHS WITH DR. Curt Bears   Any Other Special Instructions Will Be Listed Below (If Applicable).  Exercise Stress Electrocardiogram An exercise stress electrocardiogram is a test that is done to evaluate the blood supply to your heart. This test may also be called exercise stress electrocardiography. The test is done while you are walking on a treadmill. The goal  of this test is to raise your heart rate. This test is done to find areas of poor blood flow to the heart by determining the extent of coronary artery disease (CAD). CAD is defined as narrowing in one or more heart (coronary) arteries of more than 70%. If you have an abnormal test result, this may mean that you are not getting adequate blood flow to your heart during exercise. Additional testing may be needed to understand why your test was abnormal. Tell a health care provider about:  Any allergies you have.  All medicines you are taking, including vitamins, herbs, eye drops, creams, and over-the-counter medicines.  Any problems you or family members have had with anesthetic medicines.  Any blood disorders you have.  Any surgeries you have had.  Any medical conditions you have.  Possibility of pregnancy, if this applies. What are the risks? Generally, this is a safe procedure. However, as with any procedure, complications can occur. Possible complications can include:  Pain or pressure in the following areas:  Chest.  Jaw or neck.  Between your shoulder blades.  Radiating down your left arm.  Dizziness or light-headedness.  Shortness of breath.  Increased or irregular heartbeats.  Nausea or vomiting.  Heart attack  (rare). What happens before the procedure?  Avoid all forms of caffeine 24 hours before your test or as directed by your health care provider. This includes coffee, tea (even decaffeinated tea), caffeinated sodas, chocolate, cocoa, and certain pain medicines.  Follow your health care provider's instructions regarding eating and drinking before the test.  Take your medicines as directed at regular times with water unless instructed otherwise. Exceptions may include:  If you have diabetes, ask how you are to take your insulin or pills. It is common to adjust insulin dosing the morning of the test.  If you are taking beta-blocker medicines, it is important to talk to your health care provider about these medicines well before the date of your test. Taking beta-blocker medicines may interfere with the test. In some cases, these medicines need to be changed or stopped 24 hours or more before the test.  If you wear a nitroglycerin patch, it may need to be removed prior to the test. Ask your health care provider if the patch should be removed before the test.  If you use an inhaler for any breathing condition, bring it with you to the test.  If you are an outpatient, bring a snack so you can eat right after the stress phase of the test.  Do not smoke for 4 hours prior to the test or as directed by your health care provider.  Do not apply lotions, powders, creams, or oils on your chest prior to the test.  Wear loose-fitting clothes and comfortable shoes for the test. This test involves walking on a treadmill. What happens during the procedure?  Multiple patches (electrodes) will be put on your chest. If needed, small areas of your chest may have to be shaved to get better contact with the electrodes. Once the electrodes are attached to your body, multiple wires will be attached to the electrodes and your heart rate will be monitored.  Your heart will be monitored both at rest and while  exercising.  You will walk on a treadmill. The treadmill will be started at a slow pace. The treadmill speed and incline will gradually be increased to raise your heart rate. What happens after the procedure?  Your heart rate and blood pressure will be  monitored after the test.  You may return to your normal schedule including diet, activities, and medicines, unless your health care provider tells you otherwise. This information is not intended to replace advice given to you by your health care provider. Make sure you discuss any questions you have with your health care provider. Document Released: 02/23/2000 Document Revised: 08/03/2015 Document Reviewed: 11/02/2012 Elsevier Interactive Patient Education  2017 [employer].     If you need a refill on your cardiac medications before your next appointment, please call your pharmacy.      Signed, Angelena Form, PA-C  07/09/2016 8:58 AM    [facility] [place], [place], [place]  [address] Phone: [phone]; Fax: [phone]

## 2016-07-08 NOTE — Telephone Encounter (Signed)
New message   Patient calling   Pt c/o of Chest Pain: STAT if CP now or developed within 24 hours  1. Are you having CP right now? Yes - a little bit - pain level  1-10 : 5  2. Are you experiencing any other symptoms (ex. SOB, nausea, vomiting, sweating)? No   3. How long have you been experiencing CP? Two week / went to PCP gave medication   4. Is your CP continuous or coming and going? Continuous   5. Have you taken Nitroglycerin? No   ?

## 2016-07-08 NOTE — Telephone Encounter (Signed)
Call transferred directly to triage. Per the patient, she has had complaints of chest pain x 2 weeks. She states this typically occurs when she wakes up and lasts up until the afternoon. She will notice palpitations at times that are associated with her chest pain. Pain is midsternal and between her shoulder blades. She also complains of fatigue. She states she saw her PCP recently and she was started on citalopram, but this does not seem to be helping.   I offered her an appointment for further evaluation of her symptoms as I am unsure if she is having palpitations (possibly related to PVC's/ PAC's) that are causing her symptoms.  She is agreeable. She is requesting a morning appointment. She is scheduled for follow up with Bonney Leitz, PA on 5/1 at 8:00 am.

## 2016-07-09 ENCOUNTER — Encounter: Payer: Self-pay | Admitting: Physician Assistant

## 2016-07-09 ENCOUNTER — Ambulatory Visit (INDEPENDENT_AMBULATORY_CARE_PROVIDER_SITE_OTHER): Payer: BLUE CROSS/BLUE SHIELD | Admitting: Physician Assistant

## 2016-07-09 VITALS — BP 96/62 | HR 90 | Ht 61.0 in | Wt 98.4 lb

## 2016-07-09 DIAGNOSIS — E039 Hypothyroidism, unspecified: Secondary | ICD-10-CM | POA: Diagnosis not present

## 2016-07-09 DIAGNOSIS — R079 Chest pain, unspecified: Secondary | ICD-10-CM

## 2016-07-09 DIAGNOSIS — R002 Palpitations: Secondary | ICD-10-CM | POA: Diagnosis not present

## 2016-07-09 DIAGNOSIS — R0600 Dyspnea, unspecified: Secondary | ICD-10-CM

## 2016-07-09 DIAGNOSIS — R0609 Other forms of dyspnea: Secondary | ICD-10-CM | POA: Diagnosis not present

## 2016-07-09 MED ORDER — METOPROLOL TARTRATE 25 MG PO TABS: 12.5000 mg | ORAL_TABLET | Freq: Two times a day (BID) | ORAL | 2 refills | Status: DC

## 2016-07-09 NOTE — Patient Instructions (Addendum)
Medication Instructions:  Your physician has recommended you make the following change in your medication:  1.  START Lopressor 25 mg taking 1/2 tablet twice a day  Labwork: None ordered  Testing/Procedures: Your physician has requested that you have an exercise tolerance test. For further information please visit [email or web address]. Please also follow instruction sheet, as given.   Follow-Up: Your physician recommends that you schedule a follow-up appointment in: 3 MONTHS WITH DR. Curt Bears   Any Other Special Instructions Will Be Listed Below (If Applicable).  Exercise Stress Electrocardiogram An exercise stress electrocardiogram is a test that is done to evaluate the blood supply to your heart. This test may also be called exercise stress electrocardiography. The test is done while you are walking on a treadmill. The goal of this test is to raise your heart rate. This test is done to find areas of poor blood flow to the heart by determining the extent of coronary artery disease (CAD). CAD is defined as narrowing in one or more heart (coronary) arteries of more than 70%. If you have an abnormal test result, this may mean that you are not getting adequate blood flow to your heart during exercise. Additional testing may be needed to understand why your test was abnormal. Tell a health care provider about:  Any allergies you have.  All medicines you are taking, including vitamins, herbs, eye drops, creams, and over-the-counter medicines.  Any problems you or family members have had with anesthetic medicines.  Any blood disorders you have.  Any surgeries you have had.  Any medical conditions you have.  Possibility of pregnancy, if this applies. What are the risks? Generally, this is a safe procedure. However, as with any procedure, complications can occur. Possible complications can include:  Pain or pressure in the following areas:  Chest.  Jaw or neck.  Between your  shoulder blades.  Radiating down your left arm.  Dizziness or light-headedness.  Shortness of breath.  Increased or irregular heartbeats.  Nausea or vomiting.  Heart attack (rare). What happens before the procedure?  Avoid all forms of caffeine 24 hours before your test or as directed by your health care provider. This includes coffee, tea (even decaffeinated tea), caffeinated sodas, chocolate, cocoa, and certain pain medicines.  Follow your health care provider's instructions regarding eating and drinking before the test.  Take your medicines as directed at regular times with water unless instructed otherwise. Exceptions may include:  If you have diabetes, ask how you are to take your insulin or pills. It is common to adjust insulin dosing the morning of the test.  If you are taking beta-blocker medicines, it is important to talk to your health care provider about these medicines well before the date of your test. Taking beta-blocker medicines may interfere with the test. In some cases, these medicines need to be changed or stopped 24 hours or more before the test.  If you wear a nitroglycerin patch, it may need to be removed prior to the test. Ask your health care provider if the patch should be removed before the test.  If you use an inhaler for any breathing condition, bring it with you to the test.  If you are an outpatient, bring a snack so you can eat right after the stress phase of the test.  Do not smoke for 4 hours prior to the test or as directed by your health care provider.  Do not apply lotions, powders, creams, or oils on your chest prior to the  test.  Wear loose-fitting clothes and comfortable shoes for the test. This test involves walking on a treadmill. What happens during the procedure?  Multiple patches (electrodes) will be put on your chest. If needed, small areas of your chest may have to be shaved to get better contact with the electrodes. Once the  electrodes are attached to your body, multiple wires will be attached to the electrodes and your heart rate will be monitored.  Your heart will be monitored both at rest and while exercising.  You will walk on a treadmill. The treadmill will be started at a slow pace. The treadmill speed and incline will gradually be increased to raise your heart rate. What happens after the procedure?  Your heart rate and blood pressure will be monitored after the test.  You may return to your normal schedule including diet, activities, and medicines, unless your health care provider tells you otherwise. This information is not intended to replace advice given to you by your health care provider. Make sure you discuss any questions you have with your health care provider. Document Released: 02/23/2000 Document Revised: 08/03/2015 Document Reviewed: 11/02/2012 Elsevier Interactive Patient Education  2017 [employer].     If you need a refill on your cardiac medications before your next appointment, please call your pharmacy.

## 2016-07-17 ENCOUNTER — Ambulatory Visit (INDEPENDENT_AMBULATORY_CARE_PROVIDER_SITE_OTHER): Payer: BLUE CROSS/BLUE SHIELD

## 2016-07-17 DIAGNOSIS — R079 Chest pain, unspecified: Secondary | ICD-10-CM

## 2016-07-17 DIAGNOSIS — R0609 Other forms of dyspnea: Secondary | ICD-10-CM

## 2016-07-17 DIAGNOSIS — R0600 Dyspnea, unspecified: Secondary | ICD-10-CM

## 2016-07-17 LAB — EXERCISE TOLERANCE TEST
CHL CUP MPHR: 165 {beats}/min
CHL CUP RESTING HR STRESS: 54 {beats}/min
CSEPEDS: 0 s
CSEPEW: 10.9 METS
CSEPPHR: 142 {beats}/min
Exercise duration (min): 10 min
Percent HR: 86 %
RPE: 17

## 2016-07-23 ENCOUNTER — Telehealth: Payer: Self-pay | Admitting: Physician Assistant

## 2016-07-23 NOTE — Telephone Encounter (Signed)
Patient is returning your call,thanks. °

## 2016-07-23 NOTE — Telephone Encounter (Signed)
-----   Message from Eileen Stanford, PA-C sent at 07/17/2016  3:00 PM EDT ----- Normal stress test

## 2016-07-26 ENCOUNTER — Other Ambulatory Visit: Payer: Self-pay | Admitting: Family Medicine

## 2016-07-26 DIAGNOSIS — Z1231 Encounter for screening mammogram for malignant neoplasm of breast: Secondary | ICD-10-CM

## 2016-08-07 ENCOUNTER — Ambulatory Visit: Payer: BLUE CROSS/BLUE SHIELD | Admitting: Adult Health

## 2016-09-04 ENCOUNTER — Ambulatory Visit
Admission: RE | Admit: 2016-09-04 | Discharge: 2016-09-04 | Disposition: A | Discharge status: Home / Self Care | Payer: BLUE CROSS/BLUE SHIELD | Source: Ambulatory Visit | Attending: Family Medicine | Admitting: Family Medicine

## 2016-09-04 DIAGNOSIS — Z1231 Encounter for screening mammogram for malignant neoplasm of breast: Secondary | ICD-10-CM

## 2016-10-28 NOTE — Progress Notes (Signed)
Electrophysiology Office Note   Date:  10/29/2016   ID:  Traci, Houston 09/03/1960, MRN [id number]  PCP:  Traci Cookey, MD  Primary Electrophysiologist:  Traci Haw, MD    Chief Complaint  Patient presents with  . Follow-up    Palpitations     History of Present Illness: Traci Houston is a 56 y.o. female who presents today for electrophysiology evaluation.   She has previously having palpitations. She wore both a 48 hour and a 30 day monitor that showed no evidence of arrhythmia.   She has been having chest pain, sharp radiating to her back and jaw. Associated with palpitations. Happens intermittently. Exercises 4 times a week walking for about 10 minutes. Does not get symptoms with exercise. No dizziness or syncope. Her chest pain is not associated with exertion. She says the pain lasts up to a day at a time. It does not improve with rest. She has not found any other exacerbating or alleviating symptoms. She occasionally has palpitations when she has chest pain, which are similar to her prior episodes of palpitations. She also gets short of breath during the day. She sleeps on one pillow at night and does not have PND. Her shortness of breath is mainly in the morning.  Today, she denies symptoms of palpitations, chest pain, orthopnea, PND, lower extremity edema, claudication, dizziness, presyncope, syncope, bleeding, or neurologic sequela. The patient is tolerating medications without difficulties and is otherwise without complaint today.    Past Medical History:  Diagnosis Date  . Diastolic CHF ([place]) 7/82/9562   -mild on echo 2017 for murmur   . Heart murmur 09/08/2015   Echo 2017: -Normal LV systolic function; grade 1 diastolic dysfunction; trace AI; mild MR and TR.   Marland Kitchen Thyroid disease    No past surgical history on file.   Current Outpatient Prescriptions  Medication Sig Dispense Refill  . levothyroxine (SYNTHROID, LEVOTHROID) 100 MCG tablet Take 1 tablet (100  mcg total) by mouth daily. 100 tablet 4   No current facility-administered medications for this visit.     Allergies:   Patient has no known allergies.   Social History:  The patient  reports that she has never smoked. She has never used smokeless tobacco. She reports that she does not drink alcohol or use drugs.   Family History:  The patient's family history includes Heart disease in her sister.    ROS:  Please see the history of present illness.   Otherwise, review of systems is positive for SOB, chest pain, palpitations.   All other systems are reviewed and negative.   PHYSICAL EXAM: VS:  BP 100/62   Pulse 86   Ht 5\' 1"  (1.549 m)   Wt 96 lb 3.2 oz (43.6 kg)   LMP 08/10/2011   BMI 18.18 kg/m  , BMI Body mass index is 18.18 kg/m. GEN: Well nourished, well developed, in no acute distress  HEENT: normal  Neck: no JVD, carotid bruits, or masses Cardiac: RRR; no murmurs, rubs, or gallops,no edema  Respiratory:  clear to auscultation bilaterally, normal work of breathing GI: soft, nontender, nondistended, + BS MS: no deformity or atrophy  Skin: warm and dry Neuro:  Strength and sensation are intact Psych: euthymic mood, full affect  EKG:  EKG is not ordered today. Personal review of the ekg ordered 07/09/16 shows SR, right axis, rate 77   Recent Labs: No results found for requested labs within last 8760 hours.  Lipid Panel     Component Value Date/Time   CHOL 181 07/10/2011 1257   TRIG 153.0 (H) 07/10/2011 1257   HDL 56.80 07/10/2011 1257   CHOLHDL 3 07/10/2011 1257   VLDL 30.6 07/10/2011 1257   LDLCALC 94 07/10/2011 1257     Wt Readings from Last 3 Encounters:  10/29/16 96 lb 3.2 oz (43.6 kg)  07/09/16 98 lb 6.4 oz (44.6 kg)  07/04/16 97 lb 6.4 oz (44.2 kg)      Other studies Reviewed: Additional studies/ records that were reviewed today include: TTE 09/08/15  - Normal LV systolic function; grade 1 diastolic dysfunction; trace  AI; mild MR and  TR.  Holter 09/08/15 1. NSR 2. Sinus tachycardia 3. Noise artifact 4. No SVT, VT, or pauses  30 day monitor 09/19/15 -  Personally reviewed The initial Baseline transmission shows SINUS RHYTHM The high average heart rate seen for the monitored period was 110 BPM. The low average heart rate seen for the monitored period was 80 BPM. No Pauses noted for 3 seconds or longer. Symptoms of palpitations and fluttering associated with sinus rhythm and sinus tachycardia.  ASSESSMENT AND PLAN:  1.  Palpitations:  At this time, her palpitations are similar to her prior episodes of palpitations. She has had normal testing in the past. At this time, she does not feel that any further testing is necessary.  2. Chest pain: Currently she has very atypical features of chest pain. Her pain lasts for up to a day. She has had an exercise treadmill test without abnormality. No further workup.  3. Shortness of breath: Symptoms at this time do not sound to be related to heart failure, though she does have some shortness of breath with exertion. She had an echo in 2017 that showed diastolic dysfunction. We'll order a BNP today to further evaluate her fluid status. She does not appear to be fluid overloaded on exam.  Current medicines are reviewed at length with the patient today.   The patient does not have concerns regarding her medicines.  The following changes were made today:  None  Labs/ tests ordered today include:  Orders Placed This Encounter  Procedures  . Pro b natriuretic peptide (BNP)     Disposition:   FU 1 year  Signed, Markie Frith Meredith Leeds, MD  10/29/2016 9:42 AM     [facility] [address] [place] [address] [phone] (office) [phone] (fax)

## 2016-10-29 ENCOUNTER — Encounter: Payer: Self-pay | Admitting: Cardiology

## 2016-10-29 ENCOUNTER — Ambulatory Visit (INDEPENDENT_AMBULATORY_CARE_PROVIDER_SITE_OTHER): Payer: BLUE CROSS/BLUE SHIELD | Admitting: Cardiology

## 2016-10-29 VITALS — BP 100/62 | HR 86 | Ht 61.0 in | Wt 96.2 lb

## 2016-10-29 DIAGNOSIS — R002 Palpitations: Secondary | ICD-10-CM | POA: Diagnosis not present

## 2016-10-29 DIAGNOSIS — R0602 Shortness of breath: Secondary | ICD-10-CM | POA: Diagnosis not present

## 2016-10-29 DIAGNOSIS — R079 Chest pain, unspecified: Secondary | ICD-10-CM

## 2016-10-29 LAB — PRO B NATRIURETIC PEPTIDE: NT-Pro BNP: 34 pg/mL (ref 0–287)

## 2016-10-29 NOTE — Patient Instructions (Signed)
Medication Instructions:  Your physician recommends that you continue on your current medications as directed. Please refer to the Current Medication list given to you today.  If you need a refill on your cardiac medications before your next appointment, please call your pharmacy.   Labwork: BNP today  Testing/Procedures: None ordered  Follow-Up: Your physician wants you to follow-up in: 1 year with [facility].  You will receive a reminder letter in the mail two months in advance. If you don't receive a letter, please call our office to schedule the follow-up appointment.  Thank you for choosing [facility]!!   Trinidad Curet, RN [phone]

## 2016-10-31 ENCOUNTER — Telehealth: Payer: Self-pay | Admitting: Cardiology

## 2016-10-31 NOTE — Telephone Encounter (Signed)
[email or web address]

## 2016-10-31 NOTE — Telephone Encounter (Signed)
New message ° ° ° °Pt is returning call to nurse about labs. °

## 2016-11-01 NOTE — Telephone Encounter (Signed)
Results given to patient

## 2016-12-16 ENCOUNTER — Encounter: Payer: Self-pay | Admitting: Family Medicine

## 2016-12-16 ENCOUNTER — Ambulatory Visit (INDEPENDENT_AMBULATORY_CARE_PROVIDER_SITE_OTHER): Payer: BLUE CROSS/BLUE SHIELD | Admitting: Family Medicine

## 2016-12-16 VITALS — BP 98/70 | HR 78 | Temp 97.8°F | Wt 98.8 lb

## 2016-12-16 DIAGNOSIS — K2100 Gastro-esophageal reflux disease with esophagitis, without bleeding: Secondary | ICD-10-CM

## 2016-12-16 MED ORDER — OMEPRAZOLE 20 MG PO CPDR
20.0000 mg | DELAYED_RELEASE_CAPSULE | Freq: Two times a day (BID) | ORAL | 3 refills | Status: DC
Start: 2016-12-16 — End: 2017-03-13

## 2016-12-16 NOTE — Patient Instructions (Signed)
Full liquid diet............. do not consume foods that are difficult to swallow....... Steak....... hotdogs etc  Prilosec 20 mg twice daily  We'll set you up a GI consult ASAP.

## 2016-12-16 NOTE — Progress Notes (Signed)
Traci Houston is a 56 year old Asian female nonsmoker who works at a [employer] who comes in today for evaluation of symptoms consistent with reflux esophagitis 2 years  She says she's had burning midepigastric with symptoms of acid reflux for about 2 years. About 2 months ago she noticed food starting to get stuck in her esophagus. She's changed her diet she's tried over-the-counter Prilosec but nothing has seemed to help.  GI review of systems otherwise negative.......... specifically no fever chills nausea vomiting diarrhea change in bowel habits rectal bleeding weight loss etc. Not food related  Family history negative  Medication history she only takes Synthroid for a history of hypothyroidism.  BP 98/70 (BP Location: Left Arm, Patient Position: Sitting, Cuff Size: Normal)   Pulse 78   Temp 97.8 F (36.6 C) (Oral)   Wt 98 lb 12.8 oz (44.8 kg)   LMP 08/10/2011   BMI 18.67 kg/m  In general she is a well-developed thin female no acute distress examination the abdomen was abdomen is soft bowel sounds are normal liver spleen kidneys not enlarged I compression no masses.  #1 symptoms of hyperacidity now with symptoms consistent with esophageal stricture......... full liquid diet...Marland KitchenMarland KitchenMarland Kitchen Prilosec twice a day....... GI consult ASAP

## 2016-12-18 ENCOUNTER — Encounter: Payer: Self-pay | Admitting: Gastroenterology

## 2016-12-18 ENCOUNTER — Ambulatory Visit (INDEPENDENT_AMBULATORY_CARE_PROVIDER_SITE_OTHER): Payer: BLUE CROSS/BLUE SHIELD | Admitting: Gastroenterology

## 2016-12-18 VITALS — BP 82/56 | HR 68 | Ht 61.0 in | Wt 98.0 lb

## 2016-12-18 DIAGNOSIS — R1013 Epigastric pain: Secondary | ICD-10-CM

## 2016-12-18 DIAGNOSIS — R12 Heartburn: Secondary | ICD-10-CM | POA: Diagnosis not present

## 2016-12-18 DIAGNOSIS — R079 Chest pain, unspecified: Secondary | ICD-10-CM

## 2016-12-18 NOTE — Patient Instructions (Signed)
You have been scheduled for an endoscopy. Please follow written instructions given to you at your visit today. If you use inhalers (even only as needed), please bring them with you on the day of your procedure. Your physician has requested that you go to www.startemmi.com and enter the access code given to you at your visit today. This web site gives a general overview about your procedure. However, you should still follow specific instructions given to you by our office regarding your preparation for the procedure.  You have been scheduled for an abdominal ultrasound at [facility] (1st floor of hospital) on 12/23/16 at 930 am. Please arrive 15 minutes prior to your appointment for registration. Make certain not to have anything to eat or drink 6 hours prior to your appointment. Should you need to reschedule your appointment, please contact radiology at [phone]. This test typically takes about 30 minutes to perform.  Your physician has requested that you go to the basement for the following lab work before leaving today:  Normal BMI (Body Mass Index- based on height and weight) is between 19 and 25. Your BMI today is Body mass index is 18.52 kg/m. Marland Kitchen Please consider follow up  regarding your BMI with your Primary Care Provider.

## 2016-12-18 NOTE — Progress Notes (Signed)
    History of Present Illness: This is a 56 year old female referred by Dorena Cookey, MD for the evaluation of epigastric pain, heartburn, anorexia. She also relates problems with intermittent chest discomfort and palpitations. She states she took Prilosec OTC for several days and her symptoms improved but did not resolve. She relates vague swallowing problems on a couple of occasions.  No Known Allergies Outpatient Medications Prior to Visit  Medication Sig Dispense Refill  . levothyroxine (SYNTHROID, LEVOTHROID) 100 MCG tablet Take 1 tablet (100 mcg total) by mouth daily. 100 tablet 4  . omeprazole (PRILOSEC) 20 MG capsule Take 1 capsule (20 mg total) by mouth 2 (two) times daily before a meal. 100 capsule 3   No facility-administered medications prior to visit.    Past Medical History:  Diagnosis Date  . Diastolic CHF ([place]) 6/38/4665   -mild on echo 2017 for murmur   . Heart murmur 09/08/2015   Echo 2017: -Normal LV systolic function; grade 1 diastolic dysfunction; trace AI; mild MR and TR.   Marland Kitchen Thyroid disease    Past Surgical History:  Procedure Laterality Date  . EYE SURGERY     Social History   Social History  . Marital status: Married    Spouse name: N/A  . Number of children: N/A  . Years of education: N/A   Social History Main Topics  . Smoking status: Never Smoker  . Smokeless tobacco: Never Used  . Alcohol use No  . Drug use: No  . Sexual activity: Not Asked   Other Topics Concern  . None   Social History Narrative  . None   Family History  Problem Relation Age of Onset  . Heart disease Sister   . Colon cancer Neg Hx   . Breast cancer Neg Hx       Review of Systems: Pertinent positive and negative review of systems were noted in the above HPI section. All other review of systems were otherwise negative.   Physical Exam: General: Well developed, well nourished, no acute distress Head: Normocephalic and atraumatic Eyes:  sclerae anicteric,  EOMI Ears: Normal auditory acuity Mouth: No deformity or lesions Neck: Supple, no masses or thyromegaly Lungs: Clear throughout to auscultation Heart: Regular rate and rhythm; no murmurs, rubs or bruits Abdomen: Soft, non tender and non distended. No masses, hepatosplenomegaly or hernias noted. Normal Bowel sounds Rectal: not done Musculoskeletal: Symmetrical with no gross deformities  Skin: No lesions on visible extremities Pulses:  Normal pulses noted Extremities: No clubbing, cyanosis, edema or deformities noted Neurological: Alert oriented x 4, grossly nonfocal Cervical Nodes:  No significant cervical adenopathy Inguinal Nodes: No significant inguinal adenopathy Psychological:  Alert and cooperative. Normal mood and affect  Assessment and Recommendations:  1. Epigastric pain, chest pain, heartburn, anorexia. She has had palpitations as well. Continue omeprazole 20 mg po bid. CMP, CBC, lipase, TSH today. Schedule abd [place] and EGD. The risks (including bleeding, perforation, infection, missed lesions, medication reactions and possible hospitalization or surgery if complications occur), benefits, and alternatives to endoscopy with possible biopsy and possible dilation were discussed with the patient and they consent to proceed.    cc: Dorena Cookey, MD [address] [place], [place] [address]

## 2016-12-23 ENCOUNTER — Ambulatory Visit (HOSPITAL_COMMUNITY)
Admission: RE | Admit: 2016-12-23 | Discharge: 2016-12-23 | Disposition: A | Discharge status: Home / Self Care | Payer: BLUE CROSS/BLUE SHIELD | Source: Ambulatory Visit | Attending: Gastroenterology | Admitting: Gastroenterology

## 2016-12-23 ENCOUNTER — Other Ambulatory Visit (INDEPENDENT_AMBULATORY_CARE_PROVIDER_SITE_OTHER): Payer: BLUE CROSS/BLUE SHIELD

## 2016-12-23 ENCOUNTER — Telehealth: Payer: Self-pay

## 2016-12-23 DIAGNOSIS — R1013 Epigastric pain: Secondary | ICD-10-CM | POA: Diagnosis not present

## 2016-12-23 DIAGNOSIS — R109 Unspecified abdominal pain: Secondary | ICD-10-CM | POA: Diagnosis not present

## 2016-12-23 LAB — CBC WITH DIFFERENTIAL/PLATELET
BASOS ABS: 0 10*3/uL (ref 0.0–0.1)
Basophils Relative: 0.8 % (ref 0.0–3.0)
EOS ABS: 0.1 10*3/uL (ref 0.0–0.7)
Eosinophils Relative: 1.2 % (ref 0.0–5.0)
HCT: 43.7 % (ref 36.0–46.0)
Hemoglobin: 14.4 g/dL (ref 12.0–15.0)
LYMPHS ABS: 1.3 10*3/uL (ref 0.7–4.0)
Lymphocytes Relative: 26.6 % (ref 12.0–46.0)
MCHC: 32.9 g/dL (ref 30.0–36.0)
MCV: 88.8 fl (ref 78.0–100.0)
MONO ABS: 0.4 10*3/uL (ref 0.1–1.0)
MONOS PCT: 7.5 % (ref 3.0–12.0)
NEUTROS ABS: 3.1 10*3/uL (ref 1.4–7.7)
NEUTROS PCT: 63.9 % (ref 43.0–77.0)
PLATELETS: 322 10*3/uL (ref 150.0–400.0)
RBC: 4.93 Mil/uL (ref 3.87–5.11)
RDW: 13.1 % (ref 11.5–15.5)
WBC: 4.9 10*3/uL (ref 4.0–10.5)

## 2016-12-23 LAB — LIPASE: LIPASE: 65 U/L — AB (ref 11.0–59.0)

## 2016-12-23 LAB — TSH: TSH: 0.67 u[IU]/mL (ref 0.35–4.50)

## 2016-12-23 NOTE — Telephone Encounter (Signed)
Informed patient she never went down for lab work on 12/18/16, the day of her appt with Dr. Fuller Plan. Asked if patient can have lab work done this week and come by our lab. Patient states she will try to come by today for labs.

## 2016-12-23 NOTE — Telephone Encounter (Signed)
Patient never had labs drawn day of office visit on 12/18/16. Left a message for patient to return my call.

## 2016-12-23 NOTE — Telephone Encounter (Signed)
-----   Message from Ladene Artist, MD sent at 12/23/2016  8:31 AM EDT ----- Where is the CMP? If not done please have her come back to the lab. Thx.   ----- Message ----- From: SYSTEM Sent: 12/23/2016  12:05 AM To: Ladene Artist, MD

## 2016-12-24 ENCOUNTER — Other Ambulatory Visit: Payer: Self-pay

## 2016-12-24 DIAGNOSIS — R748 Abnormal levels of other serum enzymes: Secondary | ICD-10-CM

## 2016-12-30 ENCOUNTER — Other Ambulatory Visit (INDEPENDENT_AMBULATORY_CARE_PROVIDER_SITE_OTHER): Payer: BLUE CROSS/BLUE SHIELD

## 2016-12-30 DIAGNOSIS — R748 Abnormal levels of other serum enzymes: Secondary | ICD-10-CM | POA: Diagnosis not present

## 2016-12-30 LAB — LIPASE: LIPASE: 83 U/L — AB (ref 11.0–59.0)

## 2016-12-30 LAB — AMYLASE: AMYLASE: 92 U/L (ref 27–131)

## 2017-01-08 ENCOUNTER — Encounter: Payer: Self-pay | Admitting: Gastroenterology

## 2017-01-22 ENCOUNTER — Encounter: Payer: BLUE CROSS/BLUE SHIELD | Admitting: Gastroenterology

## 2017-02-11 ENCOUNTER — Encounter: Payer: BLUE CROSS/BLUE SHIELD | Admitting: Gastroenterology

## 2017-03-07 ENCOUNTER — Ambulatory Visit: Payer: BLUE CROSS/BLUE SHIELD | Admitting: Urgent Care

## 2017-03-07 ENCOUNTER — Encounter: Payer: Self-pay | Admitting: Urgent Care

## 2017-03-07 VITALS — BP 92/64 | HR 107 | Temp 98.1°F | Resp 16 | Ht 61.0 in | Wt 97.0 lb

## 2017-03-07 DIAGNOSIS — G8929 Other chronic pain: Secondary | ICD-10-CM

## 2017-03-07 DIAGNOSIS — E039 Hypothyroidism, unspecified: Secondary | ICD-10-CM

## 2017-03-07 DIAGNOSIS — R5383 Other fatigue: Secondary | ICD-10-CM | POA: Diagnosis not present

## 2017-03-07 DIAGNOSIS — K2100 Gastro-esophageal reflux disease with esophagitis, without bleeding: Secondary | ICD-10-CM

## 2017-03-07 DIAGNOSIS — R1013 Epigastric pain: Secondary | ICD-10-CM

## 2017-03-07 DIAGNOSIS — R748 Abnormal levels of other serum enzymes: Secondary | ICD-10-CM | POA: Diagnosis not present

## 2017-03-07 MED ORDER — FAMOTIDINE 20 MG PO TABS: 20.0000 mg | ORAL_TABLET | Freq: Two times a day (BID) | ORAL | 1 refills | Status: DC

## 2017-03-07 NOTE — Progress Notes (Signed)
  MRN: [id number] DOB: 10-04-1960  Subjective:   Traci Houston is a 56 y.o. female presenting for longstanding history of epigastric pain, GERD. She has been seen by Dr. Fuller Plan and Dr. Sherren Mocha with [facility]. She has not had an endoscopy performed. Patient has been managed with Prilosec with good relief. However, when patient stops taking Prilosec her symptoms return. Today, she reports mild intermittent gastric pain. Has had more burping, gassiness, feels burning type sensation in mid-sternum. Symptoms are worse after eating. Also has fatigue. Patient does not eat GERD causing foods. Denies fever, n/v, bloody stools, constipation. She is not currently taking Prilosec, has not had this for a few weeks now. Denies smoking cigarettes or drinking alcohol.   Traci Houston has a current medication list which includes the following prescription(s): levothyroxine and omeprazole. Also has No Known Allergies.  Traci Houston  has a past medical history of Diastolic CHF ([place]) (8/46/6599), Heart murmur (09/08/2015), and Thyroid disease. Also  has a past surgical history that includes Eye surgery. Her family history includes Heart disease in her sister.   Objective:   Vitals: BP 92/64 (BP Location: Left Arm, Patient Position: Sitting, Cuff Size: Normal)   Pulse (!) 107   Temp 98.1 F (36.7 C) (Oral)   Resp 16   Ht 5\' 1"  (1.549 m)   Wt 97 lb (44 kg)   LMP 08/10/2011   SpO2 96%   BMI 18.33 kg/m   Physical Exam  Constitutional: She is oriented to person, place, and time. She appears well-developed and well-nourished.  HENT:  Mouth/Throat: Oropharynx is clear and moist.  Cardiovascular: Normal rate, regular rhythm and intact distal pulses. Exam reveals no gallop and no friction rub.  No murmur heard. Pulmonary/Chest: No respiratory distress. She has no wheezes. She has no rales.  Abdominal: Soft. Bowel sounds are normal. She exhibits no distension and no mass. There is tenderness (epigastric). There is no guarding.    Neurological: She is alert and oriented to person, place, and time.  Skin: Skin is warm and dry. No rash noted.   Assessment and Plan :   Abdominal pain, chronic, epigastric - Plan: H. pylori breath test, Thyroid Panel With TSH, Lipase, Comprehensive metabolic panel, Lipid panel  Gastro-esophageal reflux disease with esophagitis  Elevated lipase - Plan: Lipid panel  Other fatigue - Plan: CBC with Differential/Platelet  Hypothyroidism, unspecified type   Lipase was uptrending in 12/2016, will recheck this today. Patient had U/S abdomen completed 12/23/2016 with normal findings however portions of the pancreas were obscured by gas. Labs are pending. Will have patient start Pepcid, avoid GERD causing foods. Hydrate well. Patient will f/u with me on 03/10/2017.  Jaynee Eagles, PA-C [facility] [phone] 03/07/2017  3:18 PM

## 2017-03-07 NOTE — Patient Instructions (Addendum)
If your belly pain becomes worse, try Pepcid (famotidine) to address acid reflux. Take 20mg  twice daily about 1 hour before eating.   B?nh tro ng??c d? dy th?c qu?n, Ng??i l?n Gastroesophageal Reflux Disease, Adult Thng th??ng, th?c ?n di chuy?n xu?ng th?c qu?n v ? trong d? dy ?? tiu ha. Tuy nhin, khi m?t ng??i b? b?nh tro ng??c d? dy th?c qu?n (GERD), th?c ?n v a xt trong d? dy tro ng??c tr? l?i th?c qu?n. Khi tnh tr?ng ny x?y ra, th?c qu?n b? lot v vim. Theo th?i gian, GERD c th? t?o ra nh?ng l? nh? (v?t lot) trn l?p nim m?c th?c qu?n. Nguyn nhn g gy ra? Tnh tr?ng ny do m?t v?n ?? c?a ph?n c? gi?a th?c qu?n v d? dy (c? th?t th?c qu?n d??i, hay LES) gy ra. Thng th??ng c? LES ?ng l?i sau khi th?c ?n ?i qua th?c qu?n vo d? dy. Khi LES b? y?u ho?c b?t th??ng, c? khng ?ng theo ?ng cch v ?i?u ? cho php th?c ?n v a xt d? dy tro ng??c tr? l?i th?c qu?n. LES c th? b? y?u do m?t s? ch?t ?n king nh?t ??nh, thu?c v cc tnh tr?ng b?nh l, bao g?m:  S? d?ng thu?c l.  Mang thai.  Thot v? honh.  S? d?ng nhi?u r??u.  M?t s? lo?i th?c ?n v ?? u?ng nh?t ??nh, nh? c ph, s c la, hnh v b?c h.  ?i?u g lm t?ng nguy c?? Tnh tr?ng ny hay x?y ra h?n ?:  Nh?ng ng??i t?ng cn.  Nh?ng ng??i c cc b?nh ? m lin k?t.  Nh?ng ng??i s? d?ng thu?c NSAID.  Cc d?u hi?u ho?c tri?u ch?ng l g? Nh?ng tri?u ch?ng c?a tnh tr?ng ny bao g?m:  ? nng.  Kh nu?t ho?c ?au khi nu?t.  C?m th?y nh? c m?t kh?i c?c trong c? h?ng.  C?m gic ??ng trong mi?ng.  H?i th? hi.  C nhi?u n??c b?t.  C?m gic kh ch?u trong b?ng ho?c ch??ng b?ng.  ? h?i.  ?au ng?c.  Kh th? ho?c th? kh kh.  Ho lin t?c (m?n tnh) ho?c ho vo ban ?m.  B? h?ng l?p men r?ng.  S?t cn.  Nh?ng tnh tr?ng khc nhau c th? gy ?au ng?c. B?o ??m ph?i ??n khm chuyn gia ch?m [place] s?c kh?e n?u qu v? b? ?au ng?c.  Ch?n ?on tnh tr?ng ny nh? th? no? Chuyn gia ch?m  [place] s?c kh?e c?a qu v? s? h?i v? b?nh s? v khm th?c th? cho qu v?. ?? xc ??nh qu v? b? GERD nh? hay n?ng, chuyn gia ch?m [place] s?c kh?e c?ng c th? theo di qu v? ?p ?ng v?i vi?c ?i?u tr? nh? th? no. Qu v? c?ng c th? ph?i lm cc ki?m tra khc, bao g?m:  N?i soi ?? ki?m tra d? dy v th?c qu?n b?ng m?t camera nh?.  Ki?m tra ?o n?ng ?? a xt trong th?c qu?n c?a qu v?.  Ki?m tra ?o m?c p l?c ln th?c qu?n c?a qu v?.  Nu?t bari ho?c nu?t bari ?i?u ch?nh ?? hi?n th? hnh dng, kch th??c v ch?c n?ng c?a th?c qu?n c?a qu v?.  Tnh tr?ng ny ???c ?i?u tr? nh? th? no? M?c tiu c?a ?i?u tr? l gip gi?m cc tri?u ch?ng v ng?n ng?a bi?n ch?ng. Vi?c ?i?u tr? b?nh ny c th? khc nhau ty thu?c m?c ?? n?ng c?a tri?u ch?ng. Chuyn gia  ch?m [place] s?c kh?e c?a qu v? c th? khuy?n ngh?:  Thay ??i ch? ?? ?n.  Thu?c.  Ph?u thu?t.  Tun th? nh?ng h??ng d?n ny ? nh: Ch? ?? ?n u?ng  Tun th? m?t ch? ?? ?n theo khuy?n ngh? c?a chuyn gia ch?m [place] s?c kh?e. Vi?c ny c th? l trnh cc th?c ?n v ?? u?ng nh?: ? C ph v tr (c ho?c khng c caffeine). ? ?? u?ng c ch?ar??u. ? ?? u?ng t?ng l?c v ?? u?ng dng trong th? thao. ? ?? u?ng c ga ho?c soda. ? S c la v c ca. ? B?c h v h??ng v? b?c h. ? T?i v hnh. ? C?i ng?a (Horseradish). ? Cc th?c ?n nhi?u gia v? v a xt, bao g?m h?t tiu, b?t ?t, b?t ca ri, gi?m, n??c s?t cay v n??c s?t barbecue. ? N??c qu? ho?c qu? h? cam qut, ch?ng h?n nh? cam, chanh v chanh l cam. ? Cc th?c ?n c c chua, nh? n??c x?t ??, ?t, n??c x?t salsa v pizza km x?t ??. ? Th?c ?n chin v nhi?u ch?t bo, ch?ng h?n nh? bnh rn, khoai ty chin, khoai ty rn v n??c x?t nhi?u ch?t bo. ? Th?t nhi?u ch?t bo, ch?ng h?n nh? hot dog (bnh m k?p xc xch) v cc lo?i th?t ?? v tr?ng nhi?u m?, ch?ng h?n nh? th?t n?c l?ng, xc xch, gi?m bng v th?t l?n xng khi. ? Nh?ng s?n ph?m b? s?a giu ch?t bo, nh? s?a nguyn kem, b? v pho mt kem.  ?n  cc b?a nh?, th??ng xuyn thay v cc b?a no.  Trnh u?ng nhi?u n??c khi qu v? ?n.  Trnh ?n trong kho?ng 2-3 gi? tr??c khi ?i ng?.  Trnh n?m xu?ng ngay sau khi ?n.  Khngt?p th? d?c ngay sau khi ?n. H??ng d?n chung  Ch  ??n b?t c? thay ??i no v? tri?u ch?ng c?a qu v?.  Ch? s? d?ng thu?c khng k ??n v thu?c k ??n theo ch? d?n c?a chuyn gia ch?m [place] s?c kh?e. Khng dng aspirin, ibuprofen, ho?c cc thu?c NSAID khc tr? khi chuyn gia ch?m [place] s?c kh?e c?a qu v? [place] qu v? lm nh? v?y.  Khng s? d?ng b?t k? s?n ph?m thu?c l no, bao g?m thu?c l d?ng ht, thu?c l d?ng nhai v thu?c l ?i?n t?. N?u qu v? c?n gip ?? ?? cai thu?c, hy h?i chuyn gia ch?m [place] s?c kh?e.  M?c qu?n o r?ng. Khng m?c b?t c? th? g ch?t quanh eo m c th? gy p l?c ln b?ng.  Nng (nng cao) ??u gi??ng c?a qu v? thm 6 inch (15 cm).  C? g?ng gi?m c?ng th?ng, ch?ng h?n nh? t?p yoga ho?c thi?n. N?u qu v? c?n gip ?? ?? lm gi?m c?ng th?ng, hy h?i chuyn gia ch?m [place] s?c kh?e.  N?u qu v? th?a cn, hy gi?m cn n?ng v? m?c c l?i cho s?c kh?e c?a qu v?. Hy h?i chuyn gia ch?m [place] s?c kh?e ?? ???c h??ng d?n v? m?c tiu gi?m cn an ton.  Tun th? t?t c? cc l?n khm theo di theo ch? d?n c?a chuyn gia ch?m [place] s?c kh?e. ?i?u ny c vai tr quan tr?ng. Hy lin l?c v?i chuyn gia ch?m [place] s?c kh?e n?u:  Qu v? c cc tri?u ch?ng m?i.  Qu v? b? s?t cn khng r nguyn nhn.  Qu v? b? kh nu?t ho?c b? ?au khi nu?t.  Qu v? th? kh kh ho?c ho dai d?ng.  Cc tri?u ch?ng c?a qu v? khng c?i thi?n sau khi ???c ?i?u tr?Sander Nephew v? b? kh?n gi?ng. Yu c?u tr? gip ngay l?p t?c n?u:  Qu v? b? ?au ? cnh tay, c?, hm, r?ng ho?c l?ng.  Qu v? th?y ?? m? hi, chng m?t ho?c chong vng.  Qu v? b? ?au ng?c ho?c kh th?.  Qu v? nn v ch?t nn ra gi?ng nh? mu ho?c b c ph.  Qu v? b? ng?t.  Phn c?a qu v? c mu ho?c mu ?en.  Qu v? khng th? nu?t, u?ng ho?c ?n. Thng tin  ny khng nh?m m?c ?ch thay th? cho l?i khuyn m chuyn gia ch?m [place] s?c kh?e ni v?i qu v?. Hy b?o ??m qu v? ph?i th?o lu?n b?t k? v?n ?? g m qu v? c v?i chuyn gia ch?m [place] s?c kh?e c?a qu v?. Document Released: 12/05/2004 Document Revised: 06/10/2016 Document Reviewed: 06/22/2014 Elsevier Interactive Patient Education  2018 [employer].     IF you received an x-ray today, you will receive an invoice from [facility]. Please contact [facility] at [phone] with questions or concerns regarding your invoice.   IF you received labwork today, you will receive an invoice from [place]. Please contact [facility] at [phone] with questions or concerns regarding your invoice.   Our billing staff will not be able to assist you with questions regarding bills from these companies.  You will be contacted with the lab results as soon as they are available. The fastest way to get your results is to activate your My Chart account. Instructions are located on the last page of this paperwork. If you have not heard from [place] regarding the results in 2 weeks, please contact this office.

## 2017-03-08 LAB — COMPREHENSIVE METABOLIC PANEL
ALBUMIN: 4.4 g/dL (ref 3.5–5.5)
ALT: 10 IU/L (ref 0–32)
AST: 20 IU/L (ref 0–40)
Albumin/Globulin Ratio: 1.5 (ref 1.2–2.2)
Alkaline Phosphatase: 63 IU/L (ref 39–117)
BUN / CREAT RATIO: 22 (ref 9–23)
BUN: 13 mg/dL (ref 6–24)
Bilirubin Total: 0.4 mg/dL (ref 0.0–1.2)
CALCIUM: 9.3 mg/dL (ref 8.7–10.2)
CO2: 29 mmol/L (ref 20–29)
CREATININE: 0.59 mg/dL (ref 0.57–1.00)
Chloride: 99 mmol/L (ref 96–106)
GFR calc Af Amer: 118 mL/min/{1.73_m2} (ref >59)
GFR, EST NON AFRICAN AMERICAN: 103 mL/min/{1.73_m2} (ref >59)
GLOBULIN, TOTAL: 2.9 g/dL (ref 1.5–4.5)
Glucose: 87 mg/dL (ref 65–99)
Potassium: 4.3 mmol/L (ref 3.5–5.2)
SODIUM: 142 mmol/L (ref 134–144)
TOTAL PROTEIN: 7.3 g/dL (ref 6.0–8.5)

## 2017-03-08 LAB — THYROID PANEL WITH TSH
Free Thyroxine Index: 3.3 (ref 1.2–4.9)
T3 Uptake Ratio: 32 % (ref 24–39)
T4, Total: 10.4 ug/dL (ref 4.5–12.0)
TSH: 0.147 u[IU]/mL — ABNORMAL LOW (ref 0.450–4.500)

## 2017-03-08 LAB — CBC WITH DIFFERENTIAL/PLATELET
BASOS: 1 %
Basophils Absolute: 0 10*3/uL (ref 0.0–0.2)
EOS (ABSOLUTE): 0.3 10*3/uL (ref 0.0–0.4)
EOS: 5 %
HEMATOCRIT: 43.2 % (ref 34.0–46.6)
Hemoglobin: 14.2 g/dL (ref 11.1–15.9)
IMMATURE GRANULOCYTES: 0 %
Immature Grans (Abs): 0 10*3/uL (ref 0.0–0.1)
LYMPHS ABS: 1.6 10*3/uL (ref 0.7–3.1)
Lymphs: 27 %
MCH: 28.9 pg (ref 26.6–33.0)
MCHC: 32.9 g/dL (ref 31.5–35.7)
MCV: 88 fL (ref 79–97)
MONOS ABS: 0.5 10*3/uL (ref 0.1–0.9)
Monocytes: 8 %
NEUTROS PCT: 59 %
Neutrophils Absolute: 3.5 10*3/uL (ref 1.4–7.0)
Platelets: 348 10*3/uL (ref 150–379)
RBC: 4.91 x10E6/uL (ref 3.77–5.28)
RDW: 13.2 % (ref 12.3–15.4)
WBC: 5.8 10*3/uL (ref 3.4–10.8)

## 2017-03-08 LAB — LIPID PANEL
Chol/HDL Ratio: 3.2 ratio (ref 0.0–4.4)
Cholesterol, Total: 186 mg/dL (ref 100–199)
HDL: 58 mg/dL (ref >39)
LDL CALC: 107 mg/dL — AB (ref 0–99)
Triglycerides: 107 mg/dL (ref 0–149)
VLDL Cholesterol Cal: 21 mg/dL (ref 5–40)

## 2017-03-08 LAB — LIPASE: Lipase: 58 U/L (ref 14–72)

## 2017-03-10 LAB — H. PYLORI BREATH TEST: H PYLORI BREATH TEST: POSITIVE — AB

## 2017-03-13 ENCOUNTER — Ambulatory Visit: Payer: BLUE CROSS/BLUE SHIELD | Admitting: Urgent Care

## 2017-03-13 ENCOUNTER — Encounter: Payer: Self-pay | Admitting: Urgent Care

## 2017-03-13 VITALS — BP 110/68 | HR 94 | Temp 97.5°F | Resp 16 | Ht 61.0 in | Wt 97.2 lb

## 2017-03-13 DIAGNOSIS — K279 Peptic ulcer, site unspecified, unspecified as acute or chronic, without hemorrhage or perforation: Secondary | ICD-10-CM

## 2017-03-13 DIAGNOSIS — A048 Other specified bacterial intestinal infections: Secondary | ICD-10-CM

## 2017-03-13 MED ORDER — CLARITHROMYCIN 500 MG PO TABS: 500.0000 mg | ORAL_TABLET | Freq: Two times a day (BID) | ORAL | 0 refills | Status: DC

## 2017-03-13 MED ORDER — OMEPRAZOLE 20 MG PO CPDR: 20.0000 mg | DELAYED_RELEASE_CAPSULE | Freq: Two times a day (BID) | ORAL | 1 refills | Status: DC

## 2017-03-13 MED ORDER — AMOXICILLIN 500 MG PO CAPS: 1000.0000 mg | ORAL_CAPSULE | Freq: Two times a day (BID) | ORAL | 0 refills | Status: DC

## 2017-03-13 NOTE — Progress Notes (Signed)
    MRN: [id number] DOB: January 20, 1961  Subjective:   Traci Houston is a 57 y.o. female presenting for follow up on epigastric pain. Patient last OV was 03/07/2017, had labs performed for her chronic epigastric pain. Labs showed positive H. Pylori infection. She did try Pepcid given her history of GERD with esophagitis as diagnosed by her GI doctor. Today, she reports ongoing pain, Pepcid has not helped. Denies fever, n/v, diarrhea, bloody stools.  Zaineb has a current medication list which includes the following prescription(s): famotidine, levothyroxine, and omeprazole. Also has No Known Allergies.  Zacaria  has a past medical history of Diastolic CHF ([place]) (3/83/3383), Heart murmur (09/08/2015), and Thyroid disease. Also  has a past surgical history that includes Eye surgery.  Objective:   Vitals: BP 110/68   Pulse 94   Temp (!) 97.5 F (36.4 C) (Oral)   Resp 16   Ht 5\' 1"  (1.549 m)   Wt 97 lb 3.2 oz (44.1 kg)   LMP 08/10/2011   SpO2 100%   BMI 18.37 kg/m   Physical Exam  Constitutional: She is oriented to person, place, and time. She appears well-developed and well-nourished.  Cardiovascular: Normal rate.  Pulmonary/Chest: Effort normal.  Neurological: She is alert and oriented to person, place, and time.  Psychiatric: She has a normal mood and affect.   Assessment and Plan :   Helicobacter pylori infection  Peptic ulcer disease - Plan: omeprazole (PRILOSEC) 20 MG capsule   Stop Pepcid, start triple therapy for eradication of h. Pylori. Counseled patient on potential for adverse effects with medications prescribed today, patient verbalized understanding. Return-to-clinic precautions discussed, patient verbalized understanding.   Jaynee Eagles, PA-C [facility] [phone] 03/13/2017 9:22 AM

## 2017-03-13 NOTE — Patient Instructions (Addendum)
Helicobacter Pylori Infection Helicobacter pylori infection is an infection in the stomach that is caused by the Helicobacter pylori (H. pylori) bacteria. This type of bacteria often lives in the lining of the stomach. The infection can cause ulcers and irritation (gastritis) in some people. It is the most common cause of ulcers in the stomach (gastric ulcer) and in the upper part of the intestine (duodenal ulcer). Having this infection may also increase the risk of stomach cancer and a type of white blood cell cancer (lymphoma) that affects the stomach. What are the causes? H. pylori is a type of bacteria that is often found in the stomachs of healthy people. The bacteria may be passed from person to person through contact with stool or saliva. It is not known why some people develop ulcers, gastritis, or cancer from the infection. What increases the risk? This condition is more likely to develop in people who:  Have family members with the infection.  Live with many other people, such as in a dormitory.  Are of African, Hispanic, or Asian descent.  What are the signs or symptoms? Most people with this infection do not have symptoms. If you do have symptoms, they may include:  Heartburn.  Stomach pain.  Nausea.  Vomiting.  Blood-tinged vomit.  Loss of appetite.  Bad breath.  How is this diagnosed? This condition may be diagnosed based on your symptoms, a physical exam, and various tests. Tests may include:  Blood tests or stool tests to check for the proteins (antibodies) that your body may produce in response to the bacteria. These tests are the best way to confirm the diagnosis.  A breath test to check for the type of gas that the H. pylori bacteria release after breaking down a substance called urea. For the test, you are asked to drink urea. This test is often done after treatment in order to find out if the treatment worked.  A procedure in which a thin, flexible tube with  a tiny camera at the end is placed into your stomach and upper intestine (upper endoscopy). Your health care provider may also take tissue samples (biopsy) to test for H. pylori and cancer.  How is this treated? Treatment for this condition usually involves taking a combination of medicines (triple therapy) for a couple of weeks. Triple therapy includes one medicine to reduce the acid in your stomach and two types of antibiotic medicines. Many drug combinations have been approved for treatment. Treatment usually kills the H. pylori and reduces your risk of cancer. You may need to be tested for H. pylori again after treatment. In some cases, the treatment may need to be repeated. Follow these instructions at home:  Take over-the-counter and prescription medicines only as told by your health care provider.  Take your antibiotics as told by your health care provider. Do not stop taking the antibiotics even if you start to feel better.  You can do all your usual activities and eat what you usually do.  Take steps to prevent future infections: ? Wash your hands often. ? Make sure the food you eat has been properly prepared. ? Drink water only from clean sources.  Keep all follow-up visits as told by your health care provider. This is important. Contact a health care provider if:  Your symptoms do not get better.  Your symptoms return after treatment. This information is not intended to replace advice given to you by your health care provider. Make sure you discuss any questions  you have with your health care provider. Document Released: 06/19/2015 Document Revised: 08/03/2015 Document Reviewed: 03/09/2014 Elsevier Interactive Patient Education  2018 [employer].     IF you received an x-ray today, you will receive an invoice from [facility]. Please contact [facility] at [phone] with questions or concerns regarding your invoice.   IF you received labwork today,  you will receive an invoice from [place]. Please contact [facility] at [phone] with questions or concerns regarding your invoice.   Our billing staff will not be able to assist you with questions regarding bills from these companies.  You will be contacted with the lab results as soon as they are available. The fastest way to get your results is to activate your My Chart account. Instructions are located on the last page of this paperwork. If you have not heard from [place] regarding the results in 2 weeks, please contact this office.

## 2017-06-21 ENCOUNTER — Other Ambulatory Visit: Payer: Self-pay | Admitting: Family Medicine

## 2017-06-23 ENCOUNTER — Other Ambulatory Visit: Payer: Self-pay | Admitting: Family Medicine

## 2017-06-23 ENCOUNTER — Telehealth: Payer: Self-pay | Admitting: Family Medicine

## 2017-06-23 DIAGNOSIS — Z1231 Encounter for screening mammogram for malignant neoplasm of breast: Secondary | ICD-10-CM

## 2017-06-23 NOTE — Telephone Encounter (Signed)
Copied from [place] [id number]. Topic: Appointment Scheduling - Scheduling Inquiry for Clinic >> Jun 23, 2017 12:09 PM Aurelio Brash B wrote: Reason for CRM: PT  is requesting a CPE apt for a morning visit,  I did not see any availability, she did not want to schedule afternoon apt.  Told her Id contact office and they may possibly  be able to work her in .

## 2017-06-24 NOTE — Telephone Encounter (Signed)
Called pt left a message to return my call in the office 

## 2017-07-02 NOTE — Telephone Encounter (Signed)
Let a detailed message for pt, requested pt to call back if has any questions.

## 2017-09-08 ENCOUNTER — Ambulatory Visit
Admission: RE | Admit: 2017-09-08 | Discharge: 2017-09-08 | Disposition: A | Discharge status: Home / Self Care | Payer: BLUE CROSS/BLUE SHIELD | Source: Ambulatory Visit | Attending: Family Medicine | Admitting: Family Medicine

## 2017-09-08 DIAGNOSIS — Z1231 Encounter for screening mammogram for malignant neoplasm of breast: Secondary | ICD-10-CM | POA: Diagnosis not present

## 2017-09-13 ENCOUNTER — Other Ambulatory Visit: Payer: Self-pay | Admitting: Family Medicine

## 2017-11-12 ENCOUNTER — Ambulatory Visit (INDEPENDENT_AMBULATORY_CARE_PROVIDER_SITE_OTHER): Payer: BLUE CROSS/BLUE SHIELD | Admitting: Family Medicine

## 2017-11-12 ENCOUNTER — Encounter: Payer: Self-pay | Admitting: Family Medicine

## 2017-11-12 ENCOUNTER — Encounter

## 2017-11-12 VITALS — BP 100/60 | HR 87 | Temp 97.8°F | Ht 60.25 in | Wt 95.6 lb

## 2017-11-12 DIAGNOSIS — E039 Hypothyroidism, unspecified: Secondary | ICD-10-CM | POA: Diagnosis not present

## 2017-11-12 DIAGNOSIS — Z1331 Encounter for screening for depression: Secondary | ICD-10-CM | POA: Diagnosis not present

## 2017-11-12 DIAGNOSIS — Z0000 Encounter for general adult medical examination without abnormal findings: Secondary | ICD-10-CM

## 2017-11-12 LAB — BASIC METABOLIC PANEL
BUN: 10 mg/dL (ref 6–23)
CALCIUM: 9.3 mg/dL (ref 8.4–10.5)
CO2: 34 mEq/L — ABNORMAL HIGH (ref 19–32)
Chloride: 98 mEq/L (ref 96–112)
Creatinine, Ser: 0.68 mg/dL (ref 0.40–1.20)
GFR: 94.71 mL/min (ref >60.00)
Glucose, Bld: 90 mg/dL (ref 70–99)
POTASSIUM: 4.1 meq/L (ref 3.5–5.1)
SODIUM: 137 meq/L (ref 135–145)

## 2017-11-12 LAB — HEPATIC FUNCTION PANEL
ALBUMIN: 4.2 g/dL (ref 3.5–5.2)
ALT: 7 U/L (ref 0–35)
AST: 18 U/L (ref 0–37)
Alkaline Phosphatase: 43 U/L (ref 39–117)
Bilirubin, Direct: 0.1 mg/dL (ref 0.0–0.3)
TOTAL PROTEIN: 7.4 g/dL (ref 6.0–8.3)
Total Bilirubin: 0.6 mg/dL (ref 0.2–1.2)

## 2017-11-12 LAB — CBC WITH DIFFERENTIAL/PLATELET
BASOS ABS: 0 10*3/uL (ref 0.0–0.1)
Basophils Relative: 0.6 % (ref 0.0–3.0)
EOS ABS: 0.1 10*3/uL (ref 0.0–0.7)
Eosinophils Relative: 1.7 % (ref 0.0–5.0)
HEMATOCRIT: 41.5 % (ref 36.0–46.0)
Hemoglobin: 13.8 g/dL (ref 12.0–15.0)
LYMPHS PCT: 23.5 % (ref 12.0–46.0)
Lymphs Abs: 1.1 10*3/uL (ref 0.7–4.0)
MCHC: 33.3 g/dL (ref 30.0–36.0)
MCV: 86.6 fl (ref 78.0–100.0)
MONOS PCT: 7.9 % (ref 3.0–12.0)
Monocytes Absolute: 0.4 10*3/uL (ref 0.1–1.0)
Neutro Abs: 3.1 10*3/uL (ref 1.4–7.7)
Neutrophils Relative %: 66.3 % (ref 43.0–77.0)
Platelets: 284 10*3/uL (ref 150.0–400.0)
RBC: 4.79 Mil/uL (ref 3.87–5.11)
RDW: 13.4 % (ref 11.5–15.5)
WBC: 4.6 10*3/uL (ref 4.0–10.5)

## 2017-11-12 LAB — LIPID PANEL
Cholesterol: 168 mg/dL (ref 0–200)
HDL: 52.7 mg/dL (ref >39.00)
LDL CALC: 94 mg/dL (ref 0–99)
NonHDL: 115.18
Total CHOL/HDL Ratio: 3
Triglycerides: 108 mg/dL (ref 0.0–149.0)
VLDL: 21.6 mg/dL (ref 0.0–40.0)

## 2017-11-12 LAB — POCT URINALYSIS DIPSTICK
Bilirubin, UA: NEGATIVE
Glucose, UA: NEGATIVE
Ketones, UA: NEGATIVE
Leukocytes, UA: NEGATIVE
NITRITE UA: NEGATIVE
PH UA: 7 (ref 5.0–8.0)
PROTEIN UA: NEGATIVE
Spec Grav, UA: 1.015 (ref 1.010–1.025)
Urobilinogen, UA: 0.2 E.U./dL

## 2017-11-12 LAB — TSH: TSH: 0.42 u[IU]/mL (ref 0.35–4.50)

## 2017-11-12 MED ORDER — LEVOTHYROXINE SODIUM 100 MCG PO TABS: ORAL_TABLET | ORAL | 4 refills | Status: DC

## 2017-11-12 NOTE — Patient Instructions (Signed)
Lab work today........... I will call you if there is anything abnormal  Continue thyroid 1 daily  Follow-up in one year sooner if any problems

## 2017-11-12 NOTE — Progress Notes (Signed)
Traci Houston is a 57 year old married female nonsmoker who comes in today for annual physical examination  She has a history of hypothyroidism. She takes Synthroid daily.  She gets routine eye care, dental care, BSE monthly, annual mammography, colonoscopy 2013 was normal.  Vaccinations she declines a flu shot  LMP was 3 years ago Pap was 2 years ago. All her Pap smears of all been normal. She is asymptomatic therefore recommend Paps every 3 years.  14 point review of systems is otherwise negative  Social history she is married lives here in [place] works at [employer].  BP 100/60 (BP Location: Left Arm, Patient Position: Sitting, Cuff Size: Normal)   Pulse 87   Temp 97.8 F (36.6 C) (Oral)   Ht 5' 0.25" (1.53 m)   Wt 95 lb 9.6 oz (43.4 kg)   LMP 08/10/2011   BMI 18.52 kg/m  Well-developed well-nourished female no acute distress vital signs stable she's afebrile HEENT were negative neck was supple thyroid is not enlarged cardiopulmonary exam normal breast exam normal abdominal exam normal pelvic and rectal not indicated. Extremities normal skin normal peripheral pulses normal  #1 healthy female  #2 history of hypothyroidism......Marland KitchenMarland KitchenThe current medical regimen is effective;  continue present plan and medications. Synthroid check labs

## 2017-12-02 ENCOUNTER — Ambulatory Visit: Payer: BLUE CROSS/BLUE SHIELD | Admitting: Emergency Medicine

## 2017-12-03 ENCOUNTER — Encounter: Payer: Self-pay | Admitting: Emergency Medicine

## 2017-12-03 ENCOUNTER — Other Ambulatory Visit: Payer: Self-pay

## 2017-12-03 ENCOUNTER — Ambulatory Visit: Payer: BLUE CROSS/BLUE SHIELD | Admitting: Emergency Medicine

## 2017-12-03 VITALS — BP 99/63 | HR 80 | Temp 97.9°F | Resp 16 | Ht 60.5 in | Wt 96.2 lb

## 2017-12-03 DIAGNOSIS — R1013 Epigastric pain: Secondary | ICD-10-CM

## 2017-12-03 DIAGNOSIS — Z8619 Personal history of other infectious and parasitic diseases: Secondary | ICD-10-CM

## 2017-12-03 MED ORDER — RANITIDINE HCL 150 MG PO TABS: 150.0000 mg | ORAL_TABLET | Freq: Every day | ORAL | 1 refills | Status: DC

## 2017-12-03 MED ORDER — OMEPRAZOLE 40 MG PO CPDR: 40.0000 mg | DELAYED_RELEASE_CAPSULE | Freq: Every day | ORAL | 3 refills | Status: DC

## 2017-12-03 NOTE — Patient Instructions (Addendum)
     If you have lab work done today you will be contacted with your lab results within the next 2 weeks.  If you have not heard from [place] then please contact us. The fastest way to get your results is to register for My Chart.   IF you received an x-ray today, you will receive an invoice from [facility]. Please contact [facility] at [phone] with questions or concerns regarding your invoice.   IF you received labwork today, you will receive an invoice from [place]. Please contact [facility] at [phone] with questions or concerns regarding your invoice.   Our billing staff will not be able to assist you with questions regarding bills from these companies.  You will be contacted with the lab results as soon as they are available. The fastest way to get your results is to activate your My Chart account. Instructions are located on the last page of this paperwork. If you have not heard from [place] regarding the results in 2 weeks, please contact this office.     Peptic Ulcer A peptic ulcer is a painful sore in the lining of your esophagus, stomach, or the first part of your small intestine. You may have pain in the area between your chest and your belly button. The most common causes of an ulcer are:  An infection.  Using certain pain medicines too often or too much.  Follow these instructions at home:  Avoid alcohol.  Avoid caffeine.  Do not use any tobacco products. These include cigarettes, chewing tobacco, and e-cigarettes. If you need help quitting, ask your doctor.  Take over-the-counter and prescription medicines only as told by your doctor. Do not stop or change your medicines unless you talk with your doctor about it first.  Keep all follow-up visits as told by your doctor. This is important. Contact a doctor if:  You do not get better in 7 days after you start treatment.  You keep having an upset stomach (indigestion) or heartburn. Get help right  away if:  You have sudden, sharp pain in your belly (abdomen).  You have lasting belly pain.  You have bloody poop (stool) or black, tarry poop.  You throw up (vomit) blood. It may look like coffee grounds.  You feel light-headed or feel like you may pass out (faint).  You get weak.  You get sweaty or feel sticky and cold to the touch (clammy). This information is not intended to replace advice given to you by your health care provider. Make sure you discuss any questions you have with your health care provider. Document Released: 05/22/2009 Document Revised: 07/12/2015 Document Reviewed: 11/26/2014 Elsevier Interactive Patient Education  [employer].

## 2017-12-03 NOTE — Progress Notes (Signed)
Traci Houston 57 y.o.   Chief Complaint  Patient presents with  . Abdominal Pain    x 1 MONTH - per patient she was seen here last year for stomcah pain    HISTORY OF PRESENT ILLNESS: This is a 57 y.o. female complaining of daily epigastric pain for the past 4 weeks.  Omeprazole not helping.  Pain increases with eating.  Constant pain that waxes and wanes with no radiation.  Has daily BMs with no rectal bleeding or melena.  Denies diarrhea.  Had similar episode about 1 year ago.  Abdominal ultrasound done on 12/23/2016 showed the following: FINDINGS: Gallbladder: No gallstones or wall thickening visualized. There is no pericholecystic fluid. No sonographic Murphy sign noted by sonographer.  Common bile duct: Diameter: 2 mm. No intrahepatic, common hepatic, or common bile duct dilatation.  Liver: No focal lesion identified. Within normal limits in parenchymal echogenicity. Portal vein is patent on color Doppler imaging with normal direction of blood flow towards the liver.  IVC: No abnormality visualized.  Pancreas: Visualized portion unremarkable. Portions of pancreas obscured by gas.  Spleen: Size and appearance within normal limits.  Right Kidney: Length: 9.9 cm. Echogenicity within normal limits. No mass or hydronephrosis visualized.  Left Kidney: Length: 9.9 cm. Echogenicity within normal limits. No mass or hydronephrosis visualized.  Abdominal aorta: No aneurysm visualized.  Other findings: No demonstrable ascites.  Recent labs from 11/12/2017 unremarkable. Patient has a history of hypothyroidism on Synthroid.  Stable. Seen by live by [facility] gastroenterology team on 12/18/2016 and upper endoscopy was planned but patient never followed up. H. pylori breath test positive on 03/07/2017.  HPI   Prior to Admission medications   Medication Sig Start Date End Date Taking? Authorizing Provider  levothyroxine (SYNTHROID, LEVOTHROID) 100 MCG tablet TAKE 1 TABLET  BY MOUTH DAILY OFFICE VISIT FOR MORE REFILLS 11/12/17   Dorena Cookey, MD    No Known Allergies  Patient Active Problem List   Diagnosis Date Noted  . Screening for depression 11/12/2017  . Mitral valve prolapse 03/18/2016  . Routine general medical examination at a health care facility 08/24/2013  . RHINITIS 06/19/2009  . Hypothyroidism 06/27/2008    Past Medical History:  Diagnosis Date  . Diastolic CHF ([place]) 2/42/3536   -mild on echo 2017 for murmur   . Heart murmur 09/08/2015   Echo 2017: -Normal LV systolic function; grade 1 diastolic dysfunction; trace AI; mild MR and TR.   Marland Kitchen Thyroid disease     Past Surgical History:  Procedure Laterality Date  . EYE SURGERY      Social History   Socioeconomic History  . Marital status: Married    Spouse name: Not on file  . Number of children: Not on file  . Years of education: Not on file  . Highest education level: Not on file  Occupational History  . Not on file  Social Needs  . Financial resource strain: Not on file  . Food insecurity:    Worry: Not on file    Inability: Not on file  . Transportation needs:    Medical: Not on file    Non-medical: Not on file  Tobacco Use  . Smoking status: Never Smoker  . Smokeless tobacco: Never Used  Substance and Sexual Activity  . Alcohol use: No  . Drug use: No  . Sexual activity: Not on file  Lifestyle  . Physical activity:    Days per week: Not on file    Minutes per  session: Not on file  . Stress: Not on file  Relationships  . Social connections:    Talks on phone: Not on file    Gets together: Not on file    Attends religious service: Not on file    Active member of club or organization: Not on file    Attends meetings of clubs or organizations: Not on file    Relationship status: Not on file  . Intimate partner violence:    Fear of current or ex partner: Not on file    Emotionally abused: Not on file    Physically abused: Not on file    Forced sexual  activity: Not on file  Other Topics Concern  . Not on file  Social History Narrative  . Not on file    Family History  Problem Relation Age of Onset  . Heart disease Sister   . Colon cancer Neg Hx   . Breast cancer Neg Hx      Review of Systems  Constitutional: Negative.  Negative for chills and fever.  HENT: Negative.   Respiratory: Negative.  Negative for cough and shortness of breath.   Cardiovascular: Negative.  Negative for chest pain and palpitations.  Gastrointestinal: Positive for abdominal pain. Negative for blood in stool, heartburn, melena, nausea and vomiting.  Genitourinary: Negative.  Negative for dysuria and hematuria.  Skin: Negative.  Negative for rash.  Neurological: Negative.  Negative for dizziness and headaches.  Endo/Heme/Allergies: Negative.     Vitals:   12/03/17 0929  BP: 99/63  Pulse: 80  Resp: 16  Temp: 97.9 F (36.6 C)  SpO2: 98%    Physical Exam  Constitutional: She is oriented to person, place, and time. She appears well-developed and well-nourished.  HENT:  Head: Normocephalic and atraumatic.  Nose: Nose normal.  Mouth/Throat: Oropharynx is clear and moist.  Eyes: Pupils are equal, round, and reactive to light. Conjunctivae and EOM are normal.  Neck: Normal range of motion. Neck supple.  Cardiovascular: Normal rate, regular rhythm and normal heart sounds.  Pulmonary/Chest: Effort normal and breath sounds normal.  Abdominal: Soft. Bowel sounds are normal. She exhibits no distension. There is no tenderness.  Musculoskeletal: Normal range of motion. She exhibits no edema.  Lymphadenopathy:    She has no cervical adenopathy.  Neurological: She is alert and oriented to person, place, and time. No sensory deficit. She exhibits normal muscle tone.  Skin: Skin is warm and dry. Capillary refill takes less than 2 seconds.  Psychiatric: She has a normal mood and affect. Her behavior is normal.  Vitals reviewed.  A total of 40 minutes was  spent in the room with the patient, greater than 50% of which was in counseling/coordination of care regarding differential diagnosis, management, medications, review of old records, blood results, imaging results and need for follow-up.   ASSESSMENT & PLAN: Vassie was seen today for abdominal pain.  Diagnoses and all orders for this visit:  Abdominal pain, epigastric -     H. pylori breath test -     Comprehensive metabolic panel -     CBC with Differential/Platelet -     Lipase -     Ambulatory referral to Gastroenterology  History of Helicobacter pylori infection -     Ambulatory referral to Gastroenterology  Other orders -     omeprazole (PRILOSEC) 40 MG capsule; Take 1 capsule (40 mg total) by mouth daily. -     ranitidine (ZANTAC) 150 MG tablet; Take 1 tablet (150  mg total) by mouth at bedtime for 14 days.     Patient Instructions       If you have lab work done today you will be contacted with your lab results within the next 2 weeks.  If you have not heard from [place] then please contact us. The fastest way to get your results is to register for My Chart.   IF you received an x-ray today, you will receive an invoice from [facility]. Please contact [facility] Radiology at [phone] with questions or concerns regarding your invoice.   IF you received labwork today, you will receive an invoice from [place]. Please contact [facility] at [phone] with questions or concerns regarding your invoice.   Our billing staff will not be able to assist you with questions regarding bills from these companies.  You will be contacted with the lab results as soon as they are available. The fastest way to get your results is to activate your My Chart account. Instructions are located on the last page of this paperwork. If you have not heard from [place] regarding the results in 2 weeks, please contact this office.     Peptic Ulcer A peptic ulcer is a painful sore in the lining  of your esophagus, stomach, or the first part of your small intestine. You may have pain in the area between your chest and your belly button. The most common causes of an ulcer are:  An infection.  Using certain pain medicines too often or too much.  Follow these instructions at home:  Avoid alcohol.  Avoid caffeine.  Do not use any tobacco products. These include cigarettes, chewing tobacco, and e-cigarettes. If you need help quitting, ask your doctor.  Take over-the-counter and prescription medicines only as told by your doctor. Do not stop or change your medicines unless you talk with your doctor about it first.  Keep all follow-up visits as told by your doctor. This is important. Contact a doctor if:  You do not get better in 7 days after you start treatment.  You keep having an upset stomach (indigestion) or heartburn. Get help right away if:  You have sudden, sharp pain in your belly (abdomen).  You have lasting belly pain.  You have bloody poop (stool) or black, tarry poop.  You throw up (vomit) blood. It may look like coffee grounds.  You feel light-headed or feel like you may pass out (faint).  You get weak.  You get sweaty or feel sticky and cold to the touch (clammy). This information is not intended to replace advice given to you by your health care provider. Make sure you discuss any questions you have with your health care provider. Document Released: 05/22/2009 Document Revised: 07/12/2015 Document Reviewed: 11/26/2014 Elsevier Interactive Patient Education  2018 Elsevier Inc.     Agustina Caroli, MD [facility]

## 2017-12-04 LAB — CBC WITH DIFFERENTIAL/PLATELET
BASOS ABS: 0 10*3/uL (ref 0.0–0.2)
Basos: 1 %
EOS (ABSOLUTE): 0.1 10*3/uL (ref 0.0–0.4)
Eos: 2 %
Hematocrit: 41.8 % (ref 34.0–46.6)
Hemoglobin: 13.3 g/dL (ref 11.1–15.9)
IMMATURE GRANS (ABS): 0 10*3/uL (ref 0.0–0.1)
IMMATURE GRANULOCYTES: 1 %
LYMPHS: 25 %
Lymphocytes Absolute: 1.1 10*3/uL (ref 0.7–3.1)
MCH: 28.3 pg (ref 26.6–33.0)
MCHC: 31.8 g/dL (ref 31.5–35.7)
MCV: 89 fL (ref 79–97)
MONOS ABS: 0.3 10*3/uL (ref 0.1–0.9)
Monocytes: 7 %
NEUTROS PCT: 64 %
Neutrophils Absolute: 2.8 10*3/uL (ref 1.4–7.0)
PLATELETS: 328 10*3/uL (ref 150–450)
RBC: 4.7 x10E6/uL (ref 3.77–5.28)
RDW: 12.6 % (ref 12.3–15.4)
WBC: 4.4 10*3/uL (ref 3.4–10.8)

## 2017-12-04 LAB — COMPREHENSIVE METABOLIC PANEL
A/G RATIO: 1.7 (ref 1.2–2.2)
ALK PHOS: 53 IU/L (ref 39–117)
ALT: 9 IU/L (ref 0–32)
AST: 16 IU/L (ref 0–40)
Albumin: 4.4 g/dL (ref 3.5–5.5)
BUN/Creatinine Ratio: 15 (ref 9–23)
BUN: 9 mg/dL (ref 6–24)
Bilirubin Total: 0.6 mg/dL (ref 0.0–1.2)
CHLORIDE: 101 mmol/L (ref 96–106)
CO2: 26 mmol/L (ref 20–29)
Calcium: 8.9 mg/dL (ref 8.7–10.2)
Creatinine, Ser: 0.61 mg/dL (ref 0.57–1.00)
GFR calc Af Amer: 116 mL/min/{1.73_m2} (ref >59)
GFR, EST NON AFRICAN AMERICAN: 101 mL/min/{1.73_m2} (ref >59)
GLOBULIN, TOTAL: 2.6 g/dL (ref 1.5–4.5)
Glucose: 77 mg/dL (ref 65–99)
POTASSIUM: 4.2 mmol/L (ref 3.5–5.2)
SODIUM: 143 mmol/L (ref 134–144)
Total Protein: 7 g/dL (ref 6.0–8.5)

## 2017-12-04 LAB — H. PYLORI BREATH COLLECTION

## 2017-12-04 LAB — LIPASE: LIPASE: 51 U/L (ref 14–72)

## 2017-12-04 LAB — H. PYLORI BREATH TEST: H PYLORI BREATH TEST: NEGATIVE

## 2017-12-05 ENCOUNTER — Encounter: Payer: Self-pay | Admitting: *Deleted

## 2017-12-15 ENCOUNTER — Telehealth: Payer: Self-pay | Admitting: Emergency Medicine

## 2017-12-15 NOTE — Telephone Encounter (Signed)
Copied from [place] [id number]. Topic: Quick Communication - See Telephone Encounter >> Dec 15, 2017  9:53 AM Bea Graff, NT wrote: CRM for notification. See Telephone encounter for: 12/15/17. Pt would like a call from a nurse to discuss her lab results. She is still not feeling well.

## 2017-12-16 ENCOUNTER — Ambulatory Visit: Payer: Self-pay | Admitting: Family Medicine

## 2017-12-16 NOTE — Telephone Encounter (Signed)
Pt called in c/o chest pain and upper back pain with "a little coughing at night".  Denies coughing up anything.   She was very difficult to triage due to language barrier.   She could speak English fairly well but had a hard time communicating symptoms.  "She wants her lungs checked".   When I asked her why she said she had been having stomach pain and saw the doctor for that.   She then said the chest pain and the stomach pain were the same for 2 weeks now.   When I tried to clarify where she was feeling pain was it in her chest, stomach or upper back or all she said,  "The stomach pain and the chest pain are the same".    Denies any N/V/D.   Said she feels short of breath at times.  After talking with her at length she was not expressing symptoms of chest pain to be cardiac related.  She sees Dr. Mitchel Honour at [facility] and Dr. Sherren Mocha at [facility] [place].   I asked her which doctor she normally sees.   She said both.   I had her scheduled with Dr. Mitchel Honour at [place] [facility] since he is the last doctor she saw and also for the stomach pain.  I called the flow coordinator at [place] [facility] because I was not able to get her scheduled.   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    The flow coordinator was able to schedule her for Dr. Mitchel Honour on 12/18/17 at 11:40am.  I made sure pt understood if her symptoms became worse to go to the ED.   She did verbalize understanding of my instructions.  She also verbalized back the correct appt time.    Reason for Disposition . [1] Chest pain(s) lasting a few seconds AND [2] persists > 3 days  Answer Assessment - Initial Assessment Questions 1. LOCATION: "Where does it hurt?"       My hurt in the back and in my chest. 2. RADIATION: "Does the pain go anywhere else?" (e.g., into neck, jaw, arms, back)     Short of breath 3. ONSET: "When did the chest pain begin?" (Minutes,  hours or days)      2 weeks ago it started. 4. PATTERN "Does the pain come and go, or has it been constant since it started?"  "Does it get worse with exertion?"      Constant. 5. DURATION: "How long does it last" (e.g., seconds, minutes, hours)     Constant 6. SEVERITY: "How bad is the pain?"  (e.g., Scale 1-10; mild, moderate, or severe)    - MILD (1-3): doesn't interfere with normal activities     - MODERATE (4-7): interferes with normal activities or awakens from sleep    - SEVERE (8-10): excruciating pain, unable to do any normal activities       Pain in upper back.    Denies any injuries or accidents.  Been coughing at night for "a little bit". 7. CARDIAC RISK FACTORS: "Do you have any history of heart problems or risk factors for heart disease?" (e.g., prior heart attack, angina; high blood pressure, diabetes, being overweight, high cholesterol, smoking, or strong family history of heart disease)     No cardiac history.  None of the above. 8. PULMONARY RISK FACTORS: "Do you have any history of lung disease?"  (e.g.,  blood clots in lung, asthma, emphysema, birth control pills)     None of the above     I see Dr. Sherren Mocha.  I go to [facility] for stomach pain.   They gave me medicine for stomach pain.    She mentioned the chest pain and the stomach was the same.    9. CAUSE: "What do you think is causing the chest pain?"     I want my lungs checked.   I don't feel good. 10. OTHER SYMPTOMS: "Do you have any other symptoms?" (e.g., dizziness, nausea, vomiting, sweating, fever, difficulty breathing, cough)       No vomiting or diarrhea.   No dizziness.  No fever.   Cough at night. 11. PREGNANCY: "Is there any chance you are pregnant?" "When was your last menstrual period?"       Not asked due to age  Protocols used: CHEST PAIN-A-AH

## 2017-12-16 NOTE — Telephone Encounter (Signed)
Spoke with pt today about concerns on labs and she informed me that she has scheduled and appointment on 12/18/2017.

## 2017-12-18 ENCOUNTER — Ambulatory Visit: Payer: BLUE CROSS/BLUE SHIELD | Admitting: Emergency Medicine

## 2017-12-18 ENCOUNTER — Ambulatory Visit (INDEPENDENT_AMBULATORY_CARE_PROVIDER_SITE_OTHER): Payer: BLUE CROSS/BLUE SHIELD

## 2017-12-18 ENCOUNTER — Encounter: Payer: Self-pay | Admitting: Emergency Medicine

## 2017-12-18 ENCOUNTER — Other Ambulatory Visit: Payer: Self-pay

## 2017-12-18 ENCOUNTER — Encounter: Payer: Self-pay | Admitting: Neurology

## 2017-12-18 VITALS — BP 97/61 | HR 86 | Temp 98.2°F | Resp 16 | Ht 60.5 in | Wt 96.0 lb

## 2017-12-18 DIAGNOSIS — R0789 Other chest pain: Secondary | ICD-10-CM

## 2017-12-18 DIAGNOSIS — R1013 Epigastric pain: Secondary | ICD-10-CM | POA: Diagnosis not present

## 2017-12-18 DIAGNOSIS — G8929 Other chronic pain: Secondary | ICD-10-CM

## 2017-12-18 DIAGNOSIS — R519 Headache, unspecified: Secondary | ICD-10-CM | POA: Insufficient documentation

## 2017-12-18 DIAGNOSIS — R51 Headache: Secondary | ICD-10-CM

## 2017-12-18 DIAGNOSIS — M549 Dorsalgia, unspecified: Secondary | ICD-10-CM

## 2017-12-18 DIAGNOSIS — R079 Chest pain, unspecified: Secondary | ICD-10-CM | POA: Diagnosis not present

## 2017-12-18 NOTE — Patient Instructions (Addendum)
     If you have lab work done today you will be contacted with your lab results within the next 2 weeks.  If you have not heard from [place] then please contact us. The fastest way to get your results is to register for My Chart.   IF you received an x-ray today, you will receive an invoice from [facility]. Please contact [facility] Radiology at [phone] with questions or concerns regarding your invoice.   IF you received labwork today, you will receive an invoice from [place]. Please contact [facility] at [phone] with questions or concerns regarding your invoice.   Our billing staff will not be able to assist you with questions regarding bills from these companies.  You will be contacted with the lab results as soon as they are available. The fastest way to get your results is to activate your My Chart account. Instructions are located on the last page of this paperwork. If you have not heard from [place] regarding the results in 2 weeks, please contact this office.     Musculoskeletal Pain Musculoskeletal pain is muscle and bone aches and pains. This pain can occur in any part of the body. Follow these instructions at home:  Only take medicines for pain, discomfort, or fever as told by your health care provider.  You may continue all activities unless the activities cause more pain. When the pain lessens, slowly resume normal activities. Gradually increase the intensity and duration of the activities or exercise.  During periods of severe pain, bed rest may be helpful. Lie or sit in any position that is comfortable, but get out of bed and walk around at least every several hours.  If directed, put ice on the injured area. ? Put ice in a plastic bag. ? Place a towel between your skin and the bag. ? Leave the ice on for 20 minutes, 2-3 times a day. Contact a health care provider if:  Your pain is getting worse.  Your pain is not relieved with medicines.  You lose function  in the area of the pain if the pain is in your arms, legs, or neck. This information is not intended to replace advice given to you by your health care provider. Make sure you discuss any questions you have with your health care provider. Document Released: 02/25/2005 Document Revised: 08/08/2015 Document Reviewed: 10/30/2012 Elsevier Interactive Patient Education  2017 [employer].

## 2017-12-18 NOTE — Progress Notes (Signed)
Traci Houston 57 y.o.   Chief Complaint  Patient presents with  . Back Pain    and chest hurts x 2 weeks per patient wants her lungs checked  . Headache    per patient hit head more than 2 years ago and had CT scan    HISTORY OF PRESENT ILLNESS: This is a 57 y.o. female complaining of pain to her upper back and at times to the chest for the past several weeks.  Concerned about her lungs.  Denies difficulty breathing, wheezing, flulike symptoms, fever, cough. Also has a history of chronic headaches since head injury 2 years ago. Also complaining of chronic epigastric discomfort, seen by me on 12/03/2017 and started on omeprazole and Zantac.  H. pylori breath test was negative.  Patient had upper endoscopy done in [place] last April and told that it was normal.  She had tested positive for H. pylori in the past.  Treated successfully. No other medical concerns or significant symptoms at this time.  HPI   Prior to Admission medications   Medication Sig Start Date End Date Taking? Authorizing Provider  levothyroxine (SYNTHROID, LEVOTHROID) 100 MCG tablet TAKE 1 TABLET BY MOUTH DAILY OFFICE VISIT FOR MORE REFILLS 11/12/17  Yes Dorena Cookey, MD  omeprazole (PRILOSEC) 40 MG capsule Take 1 capsule (40 mg total) by mouth daily. 12/03/17  Yes Lochlan Grygiel, Ines Bloomer, MD  ranitidine (ZANTAC) 150 MG tablet Take 1 tablet (150 mg total) by mouth at bedtime for 14 days. 12/03/17 12/17/17  Horald Pollen, MD    No Known Allergies  Patient Active Problem List   Diagnosis Date Noted  . Mitral valve prolapse 03/18/2016  . Hypothyroidism 06/27/2008    Past Medical History:  Diagnosis Date  . Diastolic CHF ([place]) 2/77/8242   -mild on echo 2017 for murmur   . Heart murmur 09/08/2015   Echo 2017: -Normal LV systolic function; grade 1 diastolic dysfunction; trace AI; mild MR and TR.   Marland Kitchen Thyroid disease     Past Surgical History:  Procedure Laterality Date  . EYE SURGERY      Social  History   Socioeconomic History  . Marital status: Married    Spouse name: Not on file  . Number of children: Not on file  . Years of education: Not on file  . Highest education level: Not on file  Occupational History  . Not on file  Social Needs  . Financial resource strain: Not on file  . Food insecurity:    Worry: Not on file    Inability: Not on file  . Transportation needs:    Medical: Not on file    Non-medical: Not on file  Tobacco Use  . Smoking status: Never Smoker  . Smokeless tobacco: Never Used  Substance and Sexual Activity  . Alcohol use: No  . Drug use: No  . Sexual activity: Not on file  Lifestyle  . Physical activity:    Days per week: Not on file    Minutes per session: Not on file  . Stress: Not on file  Relationships  . Social connections:    Talks on phone: Not on file    Gets together: Not on file    Attends religious service: Not on file    Active member of club or organization: Not on file    Attends meetings of clubs or organizations: Not on file    Relationship status: Not on file  . Intimate partner violence:  Fear of current or ex partner: Not on file    Emotionally abused: Not on file    Physically abused: Not on file    Forced sexual activity: Not on file  Other Topics Concern  . Not on file  Social History Narrative  . Not on file    Family History  Problem Relation Age of Onset  . Heart disease Sister   . Colon cancer Neg Hx   . Breast cancer Neg Hx      Review of Systems  Constitutional: Negative.  Negative for chills, fever and weight loss.  HENT: Negative.  Negative for congestion, nosebleeds and sore throat.   Eyes: Negative.  Negative for blurred vision and double vision.  Respiratory: Negative.  Negative for cough, hemoptysis, sputum production, shortness of breath and wheezing.   Cardiovascular: Positive for chest pain. Negative for palpitations and leg swelling.  Gastrointestinal: Positive for abdominal pain.  Negative for blood in stool, heartburn, melena, nausea and vomiting.  Genitourinary: Negative.   Musculoskeletal: Positive for back pain.  Skin: Negative.  Negative for rash.  Neurological: Negative.  Negative for dizziness and headaches.  Endo/Heme/Allergies: Negative.   All other systems reviewed and are negative.  Vitals:   12/18/17 1148  BP: 97/61  Pulse: 86  Resp: 16  Temp: 98.2 F (36.8 C)  SpO2: 99%     Physical Exam  Constitutional: She is oriented to person, place, and time. She appears well-developed and well-nourished.  HENT:  Head: Normocephalic and atraumatic.  Nose: Nose normal.  Mouth/Throat: Oropharynx is clear and moist.  Eyes: Pupils are equal, round, and reactive to light. Conjunctivae and EOM are normal.  Neck: Normal range of motion. Neck supple. No JVD present. No thyromegaly present.  Cardiovascular: Normal rate, regular rhythm and normal heart sounds.  Pulmonary/Chest: Effort normal and breath sounds normal.  Abdominal: Soft. Bowel sounds are normal. She exhibits no distension. There is no tenderness.  Musculoskeletal: Normal range of motion.  Lymphadenopathy:    She has no cervical adenopathy.  Neurological: She is alert and oriented to person, place, and time. No sensory deficit. She exhibits normal muscle tone. Coordination normal.  Skin: Skin is warm and dry. Capillary refill takes less than 2 seconds.  Psychiatric: She has a normal mood and affect. Her behavior is normal.  Vitals reviewed. Dg Chest 2 View  Result Date: 12/18/2017 CLINICAL DATA:  57 year old female with chest pain. EXAM: CHEST - 2 VIEW COMPARISON:  Chest radiograph 08/14/2015. FINDINGS: Narrow AP dimension to the chest redemonstrated. Lung volumes have increased since 2017, with a somewhat flattened appearance of the diaphragm now. Cardiac size is stable and within normal limits allowing for the AP chest dimension. Other mediastinal contours are within normal limits. Visualized  tracheal air column is within normal limits. No pneumothorax, pulmonary edema, pleural effusion or confluent pulmonary opacity. Mild chronic increased pulmonary interstitial markings appear stable. No acute osseous abnormality identified. Negative visible bowel gas pattern. IMPRESSION: 1. Evidence of new pulmonary hyperinflation since 2017. Mild underlying chronic pulmonary interstitial markings with no acute pulmonary finding. 2. 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 Electronically Signed   By: Genevie Ann M.D.   On: 12/18/2017 13:02    A total of 25 minutes was spent in the room with the patient, greater than 50% of which was in counseling/coordination of care regarding differential diagnosis, treatment, medications, and need for follow-up.   ASSESSMENT & PLAN: [employer]  was seen today for back pain and headache.  Diagnoses and all orders for this visit:  Atypical chest pain -     DG Chest 2 View; Future  Abdominal pain, epigastric -     Ambulatory referral to Gastroenterology  Upper back pain  Chronic nonintractable headache, unspecified headache type -     Ambulatory referral to Neurology      Agustina Caroli, MD [facility]

## 2018-01-22 ENCOUNTER — Ambulatory Visit: Payer: BLUE CROSS/BLUE SHIELD | Admitting: Gastroenterology

## 2018-02-18 ENCOUNTER — Encounter: Payer: Self-pay | Admitting: Gastroenterology

## 2018-02-18 ENCOUNTER — Ambulatory Visit: Payer: BLUE CROSS/BLUE SHIELD | Admitting: Gastroenterology

## 2018-02-18 VITALS — BP 94/66 | HR 84 | Ht 61.0 in | Wt 98.0 lb

## 2018-02-18 DIAGNOSIS — R1013 Epigastric pain: Secondary | ICD-10-CM

## 2018-02-18 DIAGNOSIS — R140 Abdominal distension (gaseous): Secondary | ICD-10-CM | POA: Diagnosis not present

## 2018-02-18 NOTE — Patient Instructions (Signed)
You have been given samples of FD Gard to take 1-2 capsules by mouth before meals. This can be purchased over the counter if helpful.  You can also take over the counter Gaviscon and/or Gas-X four times a day for gas and bloating.  You have been scheduled for an endoscopy. Please follow written instructions given to you at your visit today. If you use inhalers (even only as needed), please bring them with you on the day of your procedure. Your physician has requested that you go to www.startemmi.com and enter the access code given to you at your visit today. This web site gives a general overview about your procedure. However, you should still follow specific instructions given to you by our office regarding your preparation for the procedure.  Thank you for choosing me and [facility].  Pricilla Riffle. Dagoberto Ligas., MD., [facility]

## 2018-02-18 NOTE — Progress Notes (Signed)
    History of Present Illness: This is a 57 year old female with epigastric pain gas, bloating, belching, flatus.  She was diagnosed with H. pylori following a positive breath test and treated.  She states following that her symptoms improved temporarily but have now recurred.  Her symptoms are exacerbated by meals and occasionally she avoids meals when her symptoms are flaring.  Her weight has remained stable and her appetite is good.  Prior GI studies summarized below.  Minimally elevated lipase levels were noted in October 2018, 65 and 83 respectively.  Lipase has been normal on last 2 tests. Denies weight loss, constipation, diarrhea, change in stool caliber, melena, hematochezia, nausea, vomiting, dysphagia, chest pain.  H pylori breath test positive in 03/2017 and negative in 11/2017  CBC, CMP, lipase 11/2017 normal  Abd [place] 12/2016 Portions of pancreas obscured by gas. Visualized portions of pancreas appear normal. CBD = 2 mm Study otherwise unremarkable.  Colonoscopy 10/2011 internal hemorrhoids, otherwise normal   Current Medications, Allergies, Past Medical History, Past Surgical History, Family History and Social History were reviewed in [employer] record.  Physical Exam: General: Well developed, well nourished, thin, no acute distress Head: Normocephalic and atraumatic Eyes:  sclerae anicteric, EOMI Ears: Normal auditory acuity Mouth: No deformity or lesions Lungs: Clear throughout to auscultation Heart: Regular rate and rhythm; no murmurs, rubs or bruits Abdomen: Soft, non tender and non distended. No masses, hepatosplenomegaly or hernias noted. Normal Bowel sounds Rectal: Not done Musculoskeletal: Symmetrical with no gross deformities  Pulses:  Normal pulses noted Extremities: No clubbing, cyanosis, edema or deformities noted Neurological: Alert oriented x 4, grossly nonfocal Psychological:  Alert and cooperative. Normal mood and  affect   Assessment and Recommendations:  1. Epigastric pain, gas, bloating, belching, flatus.  Begin a low gas diet.  Antireflux measures.  Gaviscon and/or Gas-X 4 times daily.  FDgard 1-2 po tid ac. Continue omeprazole 40 mg daily.  Schedule EGD. The risks (including bleeding, perforation, infection, missed lesions, medication reactions and possible hospitalization or surgery if complications occur), benefits, and alternatives to endoscopy with possible biopsy and possible dilation were discussed with the patient and they consent to proceed.

## 2018-02-23 NOTE — Progress Notes (Signed)
NEUROLOGY CONSULTATION NOTE  Gracieann Stannard MRN: [id number] DOB: 10/31/1960  Referring provider:  Agustina Caroli, MD Primary care provider: Stevie Kern, MD  Reason for consult:  headaches  HISTORY OF PRESENT ILLNESS: Traci Houston is a 57 year old right-handed female with diastolic CHF and thyroid disease who presents for headaches.  History supplemented by referring provider note.  Onset: Since February 2016 following closed head injury when she fell off a chair while hanging a picture and hit the back of her head.  There was no loss of consciousness..  CT of head from 06/10/2014 was personally reviewed and was norm Location:  Crown on the left (where she hit her head).  Maybe just mild neck discomfort.   Quality:  Unable to describe.  She has associated soreness to touch  Intensity:  Moderate.  She denies new headache, thunderclap headache or severe headache that wakes her from sleep. Aura:  no Prodrome:  no Postdrome:  no Associated symptoms:  Blurred vision.  She denies associated nausea, vomiting, photophobia, phonophobia,unilateral numbness or weakness. Duration:  Unsure because goes right to sleep Frequency:  5 days a month Frequency of abortive medication: none Triggers:  none Relieving factors:  sleep Activity:  no  Current NSAIDS:  none Current analgesics:  none Current triptans:  none Current ergotamine:  none Current anti-emetic:  none Current muscle relaxants:  none Current anti-anxiolytic:  none Current sleep aide:  none Current Antihypertensive medications:  none Current Antidepressant medications:  none Current Anticonvulsant medications:  none Current anti-CGRP:  none Current Vitamins/Herbal/Supplements:  none Current Antihistamines/Decongestants:  none Other therapy:  none  Past NSAIDS:  none Past analgesics:  Tylenol Past abortive triptans:  none Past abortive ergotamine:  none Past muscle relaxants:  none Past anti-emetic:  none Past  antihypertensive medications:  none Past antidepressant medications:  none Past anticonvulsant medications:  none Past anti-CGRP:  none Past vitamins/Herbal/Supplements:  none Past antihistamines/decongestants:  none Other past therapies:  none  Caffeine: no Diet:  2 bottles of water a day Exercise:   A little bit Depression:  no; Anxiety:  no Other pain: Back pain Sleep hygiene:  good  CBC and CMP from 12/03/2017 were normal.  PAST MEDICAL HISTORY: Past Medical History:  Diagnosis Date  . Diastolic CHF ([place]) 09/08/1599   -mild on echo 2017 for murmur   . Heart murmur 09/08/2015   Echo 2017: -Normal LV systolic function; grade 1 diastolic dysfunction; trace AI; mild MR and TR.   Marland Kitchen Thyroid disease     PAST SURGICAL HISTORY: Past Surgical History:  Procedure Laterality Date  . EYE SURGERY      MEDICATIONS: Current Outpatient Medications on File Prior to Visit  Medication Sig Dispense Refill  . levothyroxine (SYNTHROID, LEVOTHROID) 100 MCG tablet TAKE 1 TABLET BY MOUTH DAILY OFFICE VISIT FOR MORE REFILLS 90 tablet 4  . omeprazole (PRILOSEC) 40 MG capsule Take 1 capsule (40 mg total) by mouth daily. 30 capsule 3   No current facility-administered medications on file prior to visit.     ALLERGIES: No Known Allergies  FAMILY HISTORY: Family History  Problem Relation Age of Onset  . Heart disease Sister   . Colon cancer Neg Hx   . Breast cancer Neg Hx     SOCIAL HISTORY: Social History   Socioeconomic History  . Marital status: Married    Spouse name: Not on file  . Number of children: 2  . Years of education: Not on file  . Highest  education level: Not on file  Occupational History  . Not on file  Social Needs  . Financial resource strain: Not on file  . Food insecurity:    Worry: Not on file    Inability: Not on file  . Transportation needs:    Medical: Not on file    Non-medical: Not on file  Tobacco Use  . Smoking status: Never Smoker  . Smokeless  tobacco: Never Used  Substance and Sexual Activity  . Alcohol use: No  . Drug use: No  . Sexual activity: Not on file  Lifestyle  . Physical activity:    Days per week: Not on file    Minutes per session: Not on file  . Stress: Not on file  Relationships  . Social connections:    Talks on phone: Not on file    Gets together: Not on file    Attends religious service: Not on file    Active member of club or organization: Not on file    Attends meetings of clubs or organizations: Not on file    Relationship status: Not on file  . Intimate partner violence:    Fear of current or ex partner: Not on file    Emotionally abused: Not on file    Physically abused: Not on file    Forced sexual activity: Not on file  Other Topics Concern  . Not on file  Social History Narrative  . Not on file    REVIEW OF SYSTEMS: Constitutional: No fevers, chills, or sweats, no generalized fatigue, change in appetite Eyes: No visual changes, double vision, eye pain Ear, nose and throat: No hearing loss, ear pain, nasal congestion, sore throat Cardiovascular: No chest pain, palpitations Respiratory:  No shortness of breath at rest or with exertion, wheezes GastrointestinaI: Abdominal discomfort Genitourinary:  No dysuria, urinary retention or frequency Musculoskeletal: Back pain Integumentary: No rash, pruritus, skin lesions Neurological: as above Psychiatric: No depression, insomnia, anxiety Endocrine: No palpitations, fatigue, diaphoresis, mood swings, change in appetite, change in weight, increased thirst Hematologic/Lymphatic:  No purpura, petechiae. Allergic/Immunologic: no itchy/runny eyes, nasal congestion, recent allergic reactions, rashes  PHYSICAL EXAM: Blood pressure 94/62, pulse 88, height 5\' 1"  (1.549 m), weight 97 lb (44 kg), last menstrual period 08/10/2011, SpO2 99 %. General: No acute distress.  Patient appears well-groomed.  Head:  Normocephalic/atraumatic Eyes:  fundi examined  but not visualized Neck: supple, no paraspinal tenderness, full range of motion Back: No paraspinal tenderness Heart: regular rate and rhythm Lungs: Clear to auscultation bilaterally. Vascular: No carotid bruits. Neurological Exam: Mental status: alert and oriented to person, place, and time, recent and remote memory intact, fund of knowledge intact, attention and concentration intact, speech fluent and not dysarthric, language intact. Cranial nerves: CN I: not tested CN II: pupils equal, round and reactive to light, visual fields intact CN III, IV, VI:  full range of motion, no nystagmus, no ptosis CN V: facial sensation intact CN VII: upper and lower face symmetric CN VIII: hearing intact CN IX, X: gag intact, uvula midline CN XI: sternocleidomastoid and trapezius muscles intact CN XII: tongue midline Bulk & Tone: normal, no fasciculations. Motor:  5/5 throughout  Sensation: temperature and vibration sensation intact. Deep Tendon Reflexes:  2+ throughout, toes downgoing.  Finger to nose testing:  Without dysmetria.  Heel to shin:  Without dysmetria.  Gait:  Normal station and stride.  Romberg negative.  IMPRESSION: Chronic post-traumatic headache, not intractable.  Symptoms are chronic.  She does not  exhibit an abnormal neurologic exam.  Therefore, further imaging is not warranted at this time.  PLAN: 1.  To help reduce frequency, we will start gabapentin 100 mg at bedtime for 1 week, then increase to 200 mg at bedtime.  If headaches not improved by the end of the prescription, we will increase dose to 300 mg at bedtime. 2.  She may use over-the-counter pain relievers for acute headache if needed.  She was advised to limit use of pain relievers to no more than 2 days out of the week to prevent rebound headache. 3.  Follow-up in 3 to 4 months.  Thank you for allowing me to take part in the care of this patient.  Metta Clines, DO  CC:  Stevie Kern, MD  Agustina Caroli,  MD

## 2018-02-24 ENCOUNTER — Ambulatory Visit: Payer: BLUE CROSS/BLUE SHIELD | Admitting: Neurology

## 2018-02-24 ENCOUNTER — Encounter: Payer: Self-pay | Admitting: Neurology

## 2018-02-24 VITALS — BP 94/62 | HR 88 | Ht 61.0 in | Wt 97.0 lb

## 2018-02-24 DIAGNOSIS — G44329 Chronic post-traumatic headache, not intractable: Secondary | ICD-10-CM

## 2018-02-24 MED ORDER — GABAPENTIN 100 MG PO CAPS: ORAL_CAPSULE | ORAL | 0 refills | Status: DC

## 2018-02-24 NOTE — Patient Instructions (Addendum)
1.  Start gabapentin 100mg .  Take 1 capsule at bedtime for one week, then increase to 2 capsules at bedtime.  Contact us for refill.  If headaches not improved, will increase dose. 2.  Follow up in 3 to 4 months.

## 2018-03-03 ENCOUNTER — Encounter: Payer: BLUE CROSS/BLUE SHIELD | Admitting: Gastroenterology

## 2018-03-31 ENCOUNTER — Encounter: Payer: Self-pay | Admitting: Gastroenterology

## 2018-03-31 ENCOUNTER — Ambulatory Visit (AMBULATORY_SURGERY_CENTER): Payer: BLUE CROSS/BLUE SHIELD | Admitting: Gastroenterology

## 2018-03-31 VITALS — BP 121/67 | HR 77 | Temp 98.2°F | Resp 13 | Ht 61.0 in | Wt 98.0 lb

## 2018-03-31 DIAGNOSIS — R1013 Epigastric pain: Secondary | ICD-10-CM | POA: Diagnosis not present

## 2018-03-31 MED ORDER — SODIUM CHLORIDE 0.9 % IV SOLN: 500.0000 mL | Freq: Once | INTRAVENOUS | Status: DC

## 2018-03-31 NOTE — Op Note (Signed)
Traci Houston Patient Name: Traci Houston Procedure Date: 03/31/2018 9:53 AM MRN: [id number] Endoscopist: Ladene Artist , MD Age: 58 Referring MD:  Date of Birth: November 22, 1960 Gender: Female Account #: [id number] Procedure:                Upper GI endoscopy Indications:              Epigastric abdominal pain Medicines:                Monitored Anesthesia Care Procedure:                Pre-Anesthesia Assessment:                           - Prior to the procedure, a History and Physical                            was performed, and patient medications and                            allergies were reviewed. The patient's tolerance of                            previous anesthesia was also reviewed. The risks                            and benefits of the procedure and the sedation                            options and risks were discussed with the patient.                            All questions were answered, and informed consent                            was obtained. Prior Anticoagulants: The patient has                            taken no previous anticoagulant or antiplatelet                            agents. ASA Grade Assessment: II - A patient with                            mild systemic disease. After reviewing the risks                            and benefits, the patient was deemed in                            satisfactory condition to undergo the procedure.                           After obtaining informed consent, the endoscope was  passed under direct vision. Throughout the                            procedure, the patient's blood pressure, pulse, and                            oxygen saturations were monitored continuously. The                            Model GIF-[id number]) scope was introduced                            through the mouth, and advanced to the second part                            of duodenum. The upper GI  endoscopy was                            accomplished without difficulty. The patient                            tolerated the procedure well. Scope In: Scope Out: Findings:                 The esophagus was normal.                           The stomach was normal.                           The examined duodenum was normal.                           The cardia and gastric fundus were normal on                            retroflexion. Complications:            No immediate complications. Estimated Blood Loss:     Estimated blood loss: none. Impression:               - Normal esophagus.                           - Normal stomach.                           - Normal examined duodenum.                           - No specimens collected. Recommendation:           - Patient has a contact number available for                            emergencies. The signs and symptoms of potential  delayed complications were discussed with the                            patient. Return to normal activities tomorrow.                            Written discharge instructions were provided to the                            patient.                           - Resume previous diet.                           - Continue present medications. Ladene Artist, MD 03/31/2018 10:16:59 AM This report has been signed electronically.

## 2018-03-31 NOTE — Progress Notes (Signed)
Report given to PACU, vss 

## 2018-03-31 NOTE — Patient Instructions (Signed)
Everything was normal today.  Continue present medications.  YOU HAD AN ENDOSCOPIC PROCEDURE TODAY AT [facility]:   Refer to the procedure report that was given to you for any specific questions about what was found during the examination.  If the procedure report does not answer your questions, please call your gastroenterologist to clarify.  If you requested that your care partner not be given the details of your procedure findings, then the procedure report has been included in a sealed envelope for you to review at your convenience later.  YOU SHOULD EXPECT: Some feelings of bloating in the abdomen. Passage of more gas than usual.  Walking can help get rid of the air that was put into your GI tract during the procedure and reduce the bloating. If you had a lower endoscopy (such as a colonoscopy or flexible sigmoidoscopy) you may notice spotting of blood in your stool or on the toilet paper. If you underwent a bowel prep for your procedure, you may not have a normal bowel movement for a few days.  Please Note:  You might notice some irritation and congestion in your nose or some drainage.  This is from the oxygen used during your procedure.  There is no need for concern and it should clear up in a day or so.  SYMPTOMS TO REPORT IMMEDIATELY:  Following upper endoscopy (EGD)  Vomiting of blood or coffee ground material  New chest pain or pain under the shoulder blades  Painful or persistently difficult swallowing  New shortness of breath  Fever of 100F or higher  Black, tarry-looking stools  For urgent or emergent issues, a gastroenterologist can be reached at any hour by calling [phone].   DIET:  We do recommend a small meal at first, but then you may proceed to your regular diet.  Drink plenty of fluids but you should avoid alcoholic beverages for 24 hours.  ACTIVITY:  You should plan to take it easy for the rest of today and you should NOT DRIVE or use heavy  machinery until tomorrow (because of the sedation medicines used during the test).    FOLLOW UP: Our staff will call the number listed on your records the next business day following your procedure to check on you and address any questions or concerns that you may have regarding the information given to you following your procedure. If we do not reach you, we will leave a message.  However, if you are feeling well and you are not experiencing any problems, there is no need to return our call.  We will assume that you have returned to your regular daily activities without incident.  If any biopsies were taken you will be contacted by phone or by letter within the next 1-3 weeks.  Please call us at [phone] if you have not heard about the biopsies in 3 weeks.    SIGNATURES/CONFIDENTIALITY: You and/or your care partner have signed paperwork which will be entered into your electronic medical record.  These signatures attest to the fact that that the information above on your After Visit Summary has been reviewed and is understood.  Full responsibility of the confidentiality of this discharge information lies with you and/or your care-partner.

## 2018-04-01 ENCOUNTER — Telehealth: Payer: Self-pay | Admitting: *Deleted

## 2018-04-01 NOTE — Telephone Encounter (Signed)
  Follow up Call-  Call back number 03/31/2018  Post procedure Call Back phone  # [phone]  Permission to leave phone message Yes  Some recent data might be hidden     Patient questions:  Do you have a fever, pain , or abdominal swelling? No. Pain Score  0 *  Have you tolerated food without any problems? Yes.    Have you been able to return to your normal activities? Yes.    Do you have any questions about your discharge instructions: Diet   No. Medications  No. Follow up visit  No.  Do you have questions or concerns about your Care? No.  Actions: * If pain score is 4 or above: No action needed, pain <4.

## 2018-06-26 ENCOUNTER — Ambulatory Visit: Payer: BLUE CROSS/BLUE SHIELD | Admitting: Neurology

## 2018-08-04 ENCOUNTER — Other Ambulatory Visit: Payer: Self-pay | Admitting: Cardiology

## 2018-08-04 ENCOUNTER — Other Ambulatory Visit: Payer: Self-pay | Admitting: Internal Medicine

## 2018-08-04 ENCOUNTER — Other Ambulatory Visit: Payer: Self-pay

## 2018-08-04 DIAGNOSIS — Z1231 Encounter for screening mammogram for malignant neoplasm of breast: Secondary | ICD-10-CM

## 2018-08-04 DIAGNOSIS — Z9289 Personal history of other medical treatment: Secondary | ICD-10-CM

## 2018-09-16 DIAGNOSIS — H04123 Dry eye syndrome of bilateral lacrimal glands: Secondary | ICD-10-CM | POA: Diagnosis not present

## 2018-09-23 ENCOUNTER — Other Ambulatory Visit: Payer: Self-pay

## 2018-09-23 ENCOUNTER — Ambulatory Visit
Admission: RE | Admit: 2018-09-23 | Discharge: 2018-09-23 | Disposition: A | Discharge status: Home / Self Care | Payer: BC Managed Care – PPO | Source: Ambulatory Visit | Attending: Internal Medicine | Admitting: Internal Medicine

## 2018-09-23 DIAGNOSIS — Z1231 Encounter for screening mammogram for malignant neoplasm of breast: Secondary | ICD-10-CM

## 2018-10-05 NOTE — Progress Notes (Signed)
NEUROLOGY FOLLOW UP OFFICE NOTE  Traci Houston [id number]  HISTORY OF PRESENT ILLNESS: Traci Houston is a 58 year old right-handed female with diastolic CHF and thyroid disease who follows up for headaches.  UPDATE: In December, she started on gabapentin but discontinued after a couple of weeks because it made her feel bad.  Now denies headaches but continues to have moderate persistent soreness on the scalp (at the crown).  Frequency of abortive medication: none Current NSAIDS:  none Current analgesics:  none Current triptans:  none Current ergotamine:  none Current anti-emetic:  none Current muscle relaxants:  none Current anti-anxiolytic:  none Current sleep aide:  none Current Antihypertensive medications:  none Current Antidepressant medications:  none Current Anticonvulsant medications:  none Current anti-CGRP:  none Current Vitamins/Herbal/Supplements:  none Current Antihistamines/Decongestants:  none Other therapy:  none  Caffeine: no Diet:  2 bottles of water a day Exercise:   A little bit Depression:  no; Anxiety:  no Other pain: Back pain Sleep hygiene:  good  HISTORY: Onset: Since February 2016 following closed head injury when she fell off a chair while hanging a picture and hit the back of her head.  There was no loss of consciousness..  CT of head from 06/10/2014 was personally reviewed and was normal Location:  Crown on the left (where she hit her head).  Maybe just mild neck discomfort.   Quality:  Unable to describe.  She has associated soreness to touch  Initial intensity:  Moderate.  She denies new headache, thunderclap headache or severe headache that wakes her from sleep. Aura:  no Prodrome:  no Postdrome:  no Associated symptoms:  Blurred vision.  She denies associated nausea, vomiting, photophobia, phonophobia,unilateral numbness or weakness. Initial uration:  Unsure because goes right to sleep Initial Frequency:  5 days a month Initial Frequency  of abortive medication: none Triggers:  None Relieving factors:  Sleep Activity:  no  Past NSAIDS:  none Past analgesics:  Tylenol Past abortive triptans:  none Past abortive ergotamine:  none Past muscle relaxants:  none Past anti-emetic:  none Past antihypertensive medications:  none Past antidepressant medications:  none Past anticonvulsant medications:  gabapentin 200mg  at bedtime Past anti-CGRP:  none Past vitamins/Herbal/Supplements:  none Past antihistamines/decongestants:  none Other past therapies:  none   PAST MEDICAL HISTORY: Past Medical History:  Diagnosis Date  . Diastolic CHF ([place]) 1/60/1093   -mild on echo 2017 for murmur   . Heart murmur 09/08/2015   Echo 2017: -Normal LV systolic function; grade 1 diastolic dysfunction; trace AI; mild MR and TR.   Marland Kitchen Thyroid disease     MEDICATIONS: Current Outpatient Medications on File Prior to Visit  Medication Sig Dispense Refill  . gabapentin (NEURONTIN) 100 MG capsule Take 1 capsule at bedtime for 1 week, then 2 capsules at bedtime. 60 capsule 0  . levothyroxine (SYNTHROID, LEVOTHROID) 100 MCG tablet TAKE 1 TABLET BY MOUTH DAILY OFFICE VISIT FOR MORE REFILLS 90 tablet 4  . omeprazole (PRILOSEC) 40 MG capsule Take 1 capsule (40 mg total) by mouth daily. 30 capsule 3   No current facility-administered medications on file prior to visit.     ALLERGIES: No Known Allergies  FAMILY HISTORY: Family History  Problem Relation Age of Onset  . Prostate cancer Father   . Heart disease Sister   . Colon cancer Neg Hx   . Breast cancer Neg Hx     SOCIAL HISTORY: Social History   Socioeconomic History  .  Marital status: Married    Spouse name: Not on file  . Number of children: 2  . Years of education: Not on file  . Highest education level: Not on file  Occupational History    Employer: [employer]  . Financial resource strain: Not on file  . Food insecurity    Worry: Not on file    Inability:  Not on file  . Transportation needs    Medical: Not on file    Non-medical: Not on file  Tobacco Use  . Smoking status: Never Smoker  . Smokeless tobacco: Never Used  Substance and Sexual Activity  . Alcohol use: No  . Drug use: No  . Sexual activity: Not on file  Lifestyle  . Physical activity    Days per week: Not on file    Minutes per session: Not on file  . Stress: Not on file  Relationships  . Social Herbalist on phone: Not on file    Gets together: Not on file    Attends religious service: Not on file    Active member of club or organization: Not on file    Attends meetings of clubs or organizations: Not on file    Relationship status: Not on file  . Intimate partner violence    Fear of current or ex partner: Not on file    Emotionally abused: Not on file    Physically abused: Not on file    Forced sexual activity: Not on file  Other Topics Concern  . Not on file  Social History Narrative   Patient is right-handed. She lives in a one level home, her daughter lives with her. She does not drink caffeine. She does not exercise, but states is active at work.    REVIEW OF SYSTEMS: Constitutional: No fevers, chills, or sweats, no generalized fatigue, change in appetite Eyes: No visual changes, double vision, eye pain Ear, nose and throat: No hearing loss, ear pain, nasal congestion, sore throat Cardiovascular: No chest pain, palpitations Respiratory:  No shortness of breath at rest or with exertion, wheezes GastrointestinaI: No nausea, vomiting, diarrhea, abdominal pain, fecal incontinence Genitourinary:  No dysuria, urinary retention or frequency Musculoskeletal:  No neck pain, back pain Integumentary: No rash, pruritus, skin lesions Neurological: as above Psychiatric: No depression, insomnia, anxiety Endocrine: No palpitations, fatigue, diaphoresis, mood swings, change in appetite, change in weight, increased thirst Hematologic/Lymphatic:  No purpura,  petechiae. Allergic/Immunologic: no itchy/runny eyes, nasal congestion, recent allergic reactions, rashes  PHYSICAL EXAM: Blood pressure (!) 100/57, pulse 91, temperature 98.5 F (36.9 C), height 5\' 2"  (1.575 m), weight 98 lb 6.4 oz (44.6 kg), last menstrual period 08/10/2011, SpO2 98 %. General: No acute distress.  Patient appears well-groomed.   Head:  Normocephalic/atraumatic Eyes:  Fundi examined but not visualized Neck: supple, no paraspinal tenderness, full range of motion Heart:  Regular rate and rhythm Lungs:  Clear to auscultation bilaterally Back: No paraspinal tenderness Neurological Exam: alert and oriented to person, place, and time. Attention span and concentration intact, recent and remote memory intact, fund of knowledge intact.  Speech fluent and not dysarthric, language intact.  CN II-XII intact. Bulk and tone normal, muscle strength 5/5 throughout.  Sensation to light touch, temperature and vibration intact.  Deep tendon reflexes 2+ throughout, toes downgoing.  Finger to nose and heel to shin testing intact.  Gait normal, Romberg negative.  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  PLAN: 1. Due to  persistent headache, will check MRI brain 2. Defers medication until after MRI.  Will likely start nortriptyline 10mg  at bedtime 3.  Limit use of pain relievers to no more than 2 days out of week to prevent risk of rebound or medication-overuse headache. 4.  Keep headache diary 5.  Exercise, hydration, caffeine cessation, sleep hygiene, monitor for and avoid triggers 6.  Consider:  magnesium citrate 400mg  daily, riboflavin 400mg  daily, and coenzyme Q10 100mg  three times daily 7. Always keep in mind that currently taking a hormone or birth control may be a possible trigger or aggravating factor for migraine. 8. Follow up 5 months  Metta Clines, DO

## 2018-10-06 ENCOUNTER — Other Ambulatory Visit: Payer: Self-pay

## 2018-10-06 ENCOUNTER — Ambulatory Visit (INDEPENDENT_AMBULATORY_CARE_PROVIDER_SITE_OTHER): Payer: BC Managed Care – PPO | Admitting: Neurology

## 2018-10-06 ENCOUNTER — Encounter: Payer: Self-pay | Admitting: Neurology

## 2018-10-06 VITALS — BP 100/57 | HR 91 | Temp 98.5°F | Ht 62.0 in | Wt 98.4 lb

## 2018-10-06 DIAGNOSIS — R51 Headache: Secondary | ICD-10-CM | POA: Diagnosis not present

## 2018-10-06 DIAGNOSIS — G44329 Chronic post-traumatic headache, not intractable: Secondary | ICD-10-CM

## 2018-10-06 DIAGNOSIS — R519 Headache, unspecified: Secondary | ICD-10-CM

## 2018-10-06 NOTE — Patient Instructions (Addendum)
1. Will check MRI of brain without contrast 2.  Further recommendations after MRI 3.  Follow up in 5 months.   We have sent a referral to [facility] for your MRI and they will call you directly to schedule your appointment. They are located at [place]. If you need to contact them directly please call [phone].

## 2018-11-09 ENCOUNTER — Other Ambulatory Visit: Payer: Self-pay

## 2018-11-09 ENCOUNTER — Telehealth: Payer: Self-pay

## 2018-11-09 ENCOUNTER — Ambulatory Visit
Admission: RE | Admit: 2018-11-09 | Discharge: 2018-11-09 | Disposition: A | Discharge status: Home / Self Care | Payer: BC Managed Care – PPO | Source: Ambulatory Visit | Attending: Neurology | Admitting: Neurology

## 2018-11-09 DIAGNOSIS — G44329 Chronic post-traumatic headache, not intractable: Secondary | ICD-10-CM

## 2018-11-09 DIAGNOSIS — R519 Headache, unspecified: Secondary | ICD-10-CM

## 2018-11-09 DIAGNOSIS — R51 Headache: Secondary | ICD-10-CM | POA: Diagnosis not present

## 2018-11-09 NOTE — Telephone Encounter (Signed)
Line busy at [phone]

## 2018-11-09 NOTE — Telephone Encounter (Signed)
-----   Message from Pieter Partridge, DO sent at 11/09/2018 12:16 PM EDT ----- MRI of brain is normal.  If she desires to start a medication for headache, we can prescribe nortriptyline 10mg  at bedtime, #30, refills:  2.  If headaches not improved in 1 month, she should contact us and we can increase dose.

## 2018-11-10 ENCOUNTER — Other Ambulatory Visit: Payer: Self-pay | Admitting: Neurology

## 2018-11-10 MED ORDER — NORTRIPTYLINE HCL 10 MG PO CAPS: 10.0000 mg | ORAL_CAPSULE | Freq: Every day | ORAL | 3 refills | Status: DC

## 2018-11-11 ENCOUNTER — Encounter: Payer: Self-pay | Admitting: Neurology

## 2018-11-17 ENCOUNTER — Other Ambulatory Visit: Payer: Self-pay

## 2018-11-17 ENCOUNTER — Encounter: Payer: Self-pay | Admitting: Internal Medicine

## 2018-11-17 ENCOUNTER — Other Ambulatory Visit: Payer: Self-pay | Admitting: Internal Medicine

## 2018-11-17 ENCOUNTER — Ambulatory Visit: Payer: BC Managed Care – PPO | Admitting: Internal Medicine

## 2018-11-17 VITALS — BP 102/74 | HR 88 | Temp 98.2°F | Ht 62.0 in | Wt 97.2 lb

## 2018-11-17 DIAGNOSIS — Z0000 Encounter for general adult medical examination without abnormal findings: Secondary | ICD-10-CM | POA: Diagnosis not present

## 2018-11-17 DIAGNOSIS — Z23 Encounter for immunization: Secondary | ICD-10-CM

## 2018-11-17 DIAGNOSIS — E039 Hypothyroidism, unspecified: Secondary | ICD-10-CM

## 2018-11-17 DIAGNOSIS — E559 Vitamin D deficiency, unspecified: Secondary | ICD-10-CM

## 2018-11-17 LAB — LIPID PANEL
Cholesterol: 168 mg/dL (ref 0–200)
HDL: 49.8 mg/dL (ref >39.00)
LDL Cholesterol: 93 mg/dL (ref 0–99)
NonHDL: 117.9
Total CHOL/HDL Ratio: 3
Triglycerides: 125 mg/dL (ref 0.0–149.0)
VLDL: 25 mg/dL (ref 0.0–40.0)

## 2018-11-17 LAB — CBC WITH DIFFERENTIAL/PLATELET
Basophils Absolute: 0 10*3/uL (ref 0.0–0.1)
Basophils Relative: 1 % (ref 0.0–3.0)
Eosinophils Absolute: 0.1 10*3/uL (ref 0.0–0.7)
Eosinophils Relative: 2.8 % (ref 0.0–5.0)
HCT: 41.7 % (ref 36.0–46.0)
Hemoglobin: 13.9 g/dL (ref 12.0–15.0)
Lymphocytes Relative: 34.3 % (ref 12.0–46.0)
Lymphs Abs: 1.3 10*3/uL (ref 0.7–4.0)
MCHC: 33.2 g/dL (ref 30.0–36.0)
MCV: 86.9 fl (ref 78.0–100.0)
Monocytes Absolute: 0.3 10*3/uL (ref 0.1–1.0)
Monocytes Relative: 8.2 % (ref 3.0–12.0)
Neutro Abs: 2 10*3/uL (ref 1.4–7.7)
Neutrophils Relative %: 53.7 % (ref 43.0–77.0)
Platelets: 300 10*3/uL (ref 150.0–400.0)
RBC: 4.8 Mil/uL (ref 3.87–5.11)
RDW: 13.7 % (ref 11.5–15.5)
WBC: 3.8 10*3/uL — ABNORMAL LOW (ref 4.0–10.5)

## 2018-11-17 LAB — VITAMIN D 25 HYDROXY (VIT D DEFICIENCY, FRACTURES): VITD: 28.35 ng/mL — ABNORMAL LOW (ref 30.00–100.00)

## 2018-11-17 LAB — COMPREHENSIVE METABOLIC PANEL
ALT: 8 U/L (ref 0–35)
AST: 17 U/L (ref 0–37)
Albumin: 4.1 g/dL (ref 3.5–5.2)
Alkaline Phosphatase: 48 U/L (ref 39–117)
BUN: 11 mg/dL (ref 6–23)
CO2: 32 mEq/L (ref 19–32)
Calcium: 9.3 mg/dL (ref 8.4–10.5)
Chloride: 100 mEq/L (ref 96–112)
Creatinine, Ser: 0.58 mg/dL (ref 0.40–1.20)
GFR: 106.68 mL/min (ref >60.00)
Glucose, Bld: 77 mg/dL (ref 70–99)
Potassium: 3.9 mEq/L (ref 3.5–5.1)
Sodium: 139 mEq/L (ref 135–145)
Total Bilirubin: 1 mg/dL (ref 0.2–1.2)
Total Protein: 7.3 g/dL (ref 6.0–8.3)

## 2018-11-17 LAB — TSH: TSH: 0.11 u[IU]/mL — ABNORMAL LOW (ref 0.35–4.50)

## 2018-11-17 LAB — HEMOGLOBIN A1C: Hgb A1c MFr Bld: 5.5 % (ref 4.6–6.5)

## 2018-11-17 LAB — VITAMIN B12: Vitamin B-12: 269 pg/mL (ref 211–911)

## 2018-11-17 MED ORDER — LEVOTHYROXINE SODIUM 88 MCG PO TABS: 88.0000 ug | ORAL_TABLET | Freq: Every day | ORAL | 1 refills | Status: DC

## 2018-11-17 MED ORDER — LEVOTHYROXINE SODIUM 100 MCG PO TABS: ORAL_TABLET | ORAL | 1 refills | Status: DC

## 2018-11-17 MED ORDER — VITAMIN D (ERGOCALCIFEROL) 1.25 MG (50000 UNIT) PO CAPS: 50000.0000 [IU] | ORAL_CAPSULE | ORAL | 0 refills | Status: AC

## 2018-11-17 NOTE — Addendum Note (Signed)
Addended by: Westley Hummer B on: 11/17/2018 11:58 AM   Modules accepted: Orders

## 2018-11-17 NOTE — Patient Instructions (Signed)
-  Nice seeing you today!!  -Lab work today; will notify you once results are available.  -Flu and shingles vaccinations today.  -Please schedule follow up for your physical within 3 months.

## 2018-11-17 NOTE — Addendum Note (Signed)
Addended by: Elmer Picker on: 11/17/2018 10:58 AM   Modules accepted: Orders

## 2018-11-17 NOTE — Progress Notes (Signed)
Established Patient Office Visit     CC/Reason for Visit: Establish care, discuss chronic conditions, medication refills  HPI: Traci Houston is a 58 y.o. female who is coming in today for the above mentioned reasons. Past Medical History is significant for: Hypothyroidism that has been well controlled on levothyroxine as well as migraine headaches under the care of neurology and on migraine prophylaxis with nortriptyline.  She has no acute complaints today.  She is fasting and is requesting that her lab work for physical be done today.   Past Medical/Surgical History: Past Medical History:  Diagnosis Date  . Diastolic CHF ([place]) [id number]   -mild on echo 2017 for murmur   . Heart murmur 09/08/2015   Echo 2017: -Normal LV systolic function; grade 1 diastolic dysfunction; trace AI; mild MR and TR.   Marland Kitchen Thyroid disease     Past Surgical History:  Procedure Laterality Date  . EYE SURGERY      Social History:  reports that she has never smoked. She has never used smokeless tobacco. She reports that she does not drink alcohol or use drugs.  Allergies: No Known Allergies  Family History:  Family History  Problem Relation Age of Onset  . Prostate cancer Father   . Heart disease Sister   . Colon cancer Neg Hx   . Breast cancer Neg Hx      Current Outpatient Medications:  .  levothyroxine (SYNTHROID) 100 MCG tablet, TAKE 1 TABLET BY MOUTH DAILY OFFICE VISIT FOR MORE REFILLS, Disp: 90 tablet, Rfl: 1 .  nortriptyline (PAMELOR) 10 MG capsule, Take 1 capsule (10 mg total) by mouth at bedtime., Disp: 30 capsule, Rfl: 3  Review of Systems:  Constitutional: Denies fever, chills, diaphoresis, appetite change and fatigue.  HEENT: Denies photophobia, eye pain, redness, hearing loss, ear pain, congestion, sore throat, rhinorrhea, sneezing, mouth sores, trouble swallowing, neck pain, neck stiffness and tinnitus.   Respiratory: Denies SOB, DOE, cough, chest tightness,  and wheezing.    Cardiovascular: Denies chest pain, palpitations and leg swelling.  Gastrointestinal: Denies nausea, vomiting, abdominal pain, diarrhea, constipation, blood in stool and abdominal distention.  Genitourinary: Denies dysuria, urgency, frequency, hematuria, flank pain and difficulty urinating.  Endocrine: Denies: hot or cold intolerance, sweats, changes in hair or nails, polyuria, polydipsia. Musculoskeletal: Denies myalgias, back pain, joint swelling, arthralgias and gait problem.  Skin: Denies pallor, rash and wound.  Neurological: Denies dizziness, seizures, syncope, weakness, light-headedness, numbness and headaches.  Hematological: Denies adenopathy. Easy bruising, personal or family bleeding history  Psychiatric/Behavioral: Denies suicidal ideation, mood changes, confusion, nervousness, sleep disturbance and agitation    Physical Exam: Vitals:   11/17/18 1029  BP: 102/74  Pulse: 88  Temp: 98.2 F (36.8 C)  TempSrc: Temporal  SpO2: 97%  Weight: 97 lb 3.2 oz (44.1 kg)  Height: 5\' 2"  (1.575 m)    Body mass index is 17.78 kg/m.   Constitutional: NAD, calm, comfortable Eyes: PERRL, lids and conjunctivae normal ENMT: Mucous membranes are moist. Respiratory: clear to auscultation bilaterally, no wheezing, no crackles. Normal respiratory effort. No accessory muscle use.  Cardiovascular: Regular rate and rhythm, no murmurs / rubs / gallops. No extremity edema. 2+ pedal pulses. No carotid bruits.  Psychiatric: Normal judgment and insight. Alert and oriented x 3. Normal mood.    Impression and Plan:  Hypothyroidism, unspecified type  -Last TSH was 0.420 in September 2019. -Refilled Synthroid today, TSH today.  Encounter for preventive health examination  -She will schedule return  visit for her CPE, since she is fasting today she is requesting that we do her blood work now.   Patient Instructions  -Nice seeing you today!!  -Lab work today; will notify you once results are  available.  -Flu and shingles vaccinations today.  -Please schedule follow up for your physical within 3 months.       Traci Frohlich, MD [facility]

## 2018-11-19 ENCOUNTER — Other Ambulatory Visit: Payer: Self-pay | Admitting: *Deleted

## 2018-11-19 DIAGNOSIS — E039 Hypothyroidism, unspecified: Secondary | ICD-10-CM

## 2018-11-19 DIAGNOSIS — E559 Vitamin D deficiency, unspecified: Secondary | ICD-10-CM

## 2018-11-19 MED ORDER — LEVOTHYROXINE SODIUM 88 MCG PO TABS: 88.0000 ug | ORAL_TABLET | Freq: Every day | ORAL | 1 refills | Status: DC

## 2018-12-28 ENCOUNTER — Other Ambulatory Visit: Payer: Self-pay

## 2018-12-28 ENCOUNTER — Other Ambulatory Visit (INDEPENDENT_AMBULATORY_CARE_PROVIDER_SITE_OTHER): Payer: BC Managed Care – PPO

## 2018-12-28 DIAGNOSIS — E559 Vitamin D deficiency, unspecified: Secondary | ICD-10-CM | POA: Diagnosis not present

## 2018-12-28 DIAGNOSIS — E039 Hypothyroidism, unspecified: Secondary | ICD-10-CM | POA: Diagnosis not present

## 2018-12-28 LAB — VITAMIN D 25 HYDROXY (VIT D DEFICIENCY, FRACTURES): VITD: 51.63 ng/mL (ref 30.00–100.00)

## 2018-12-28 LAB — TSH: TSH: 0.35 u[IU]/mL (ref 0.35–4.50)

## 2019-01-06 ENCOUNTER — Other Ambulatory Visit: Payer: Self-pay | Admitting: *Deleted

## 2019-01-06 DIAGNOSIS — E039 Hypothyroidism, unspecified: Secondary | ICD-10-CM

## 2019-01-06 MED ORDER — LEVOTHYROXINE SODIUM 88 MCG PO TABS: 88.0000 ug | ORAL_TABLET | Freq: Every day | ORAL | 2 refills | Status: DC

## 2019-01-18 ENCOUNTER — Other Ambulatory Visit: Payer: Self-pay

## 2019-01-18 ENCOUNTER — Ambulatory Visit (INDEPENDENT_AMBULATORY_CARE_PROVIDER_SITE_OTHER): Payer: BC Managed Care – PPO | Admitting: *Deleted

## 2019-01-18 DIAGNOSIS — Z23 Encounter for immunization: Secondary | ICD-10-CM | POA: Diagnosis not present

## 2019-02-18 ENCOUNTER — Other Ambulatory Visit: Payer: Self-pay

## 2019-02-18 ENCOUNTER — Other Ambulatory Visit (INDEPENDENT_AMBULATORY_CARE_PROVIDER_SITE_OTHER): Payer: BC Managed Care – PPO

## 2019-02-18 DIAGNOSIS — E559 Vitamin D deficiency, unspecified: Secondary | ICD-10-CM

## 2019-02-18 LAB — VITAMIN D 25 HYDROXY (VIT D DEFICIENCY, FRACTURES): VITD: 49.18 ng/mL (ref 30.00–100.00)

## 2019-03-08 ENCOUNTER — Ambulatory Visit: Payer: BC Managed Care – PPO | Admitting: Neurology

## 2019-03-11 ENCOUNTER — Ambulatory Visit: Payer: Self-pay | Admitting: *Deleted

## 2019-03-11 NOTE — Telephone Encounter (Signed)
FYI

## 2019-03-11 NOTE — Telephone Encounter (Signed)
Pt called with complaints of dizziness when she wakes up in the morning, and when she lays down; this has been going on for 1 week; she has been diagnosed with vertigo before with the last episode a year ago; she verbalized understanding, and will go to UC; the pt sees Dr Jerilee Hoh, [facility]; will route to office for notification. Reason for Disposition . [1] MODERATE dizziness (e.g., interferes with normal activities) AND [2] has NOT been evaluated by physician for this  (Exception: dizziness caused by heat exposure, sudden standing, or poor fluid intake)  Answer Assessment - Initial Assessment Questions 1. DESCRIPTION: "Describe your dizziness."     vertigo 2. LIGHTHEADED: "Do you feel lightheaded?" (e.g., somewhat faint, woozy, weak upon standing)     no 3. VERTIGO: "Do you feel like either you or the room is spinning or tilting?" (i.e. vertigo)     yes 4. SEVERITY: "How bad is it?"  "Do you feel like you are going to faint?" "Can you stand and walk?"   - MILD - walking normally   - MODERATE - interferes with normal activities (e.g., work, school)    - SEVERE - unable to stand, requires support to walk, feels like passing out now.     mild 5. ONSET:  "When did the dizziness begin?"     1 week ago 6. AGGRAVATING FACTORS: "Does anything make it worse?" (e.g., standing, change in head position)     Laying  down 7. HEART RATE: "Can you tell me your heart rate?" "How many beats in 15 seconds?"  (Note: not all patients can do this)       Pulse was 96 at [facility] 03/09/2019 8. CAUSE: "What do you think is causing the dizziness?"   vertigo 9. RECURRENT SYMPTOM: "Have you had dizziness before?" If so, ask: "When was the last time?" "What happened that time?"    1 year ago 10. OTHER SYMPTOMS: "Do you have any other symptoms?" (e.g., fever, chest pain, vomiting, diarrhea, bleeding)       intermiittent nausea 11. PREGNANCY: "Is there any chance you are pregnant?" "When was your last menstrual  period?"       no  Protocols used: DIZZINESS [facility]

## 2019-03-15 ENCOUNTER — Ambulatory Visit: Payer: BC Managed Care – PPO | Admitting: Neurology

## 2019-05-26 ENCOUNTER — Telehealth: Payer: Self-pay | Admitting: Gastroenterology

## 2019-05-26 NOTE — Telephone Encounter (Signed)
OK 

## 2019-05-26 NOTE — Telephone Encounter (Signed)
Hi Dr. Lyndel Safe,  Dr. Fuller Plan has approved and released this patient please advise on approval.   Thank you

## 2019-05-26 NOTE — Telephone Encounter (Addendum)
Hi Dr. Fuller Plan,  This patient is requesting to transfer her care over to Dr. Lyndel Safe. She said it is better for her to travel to the [place] office.  Please advise on scheduling thank you!

## 2019-05-27 NOTE — Telephone Encounter (Signed)
No problems at all RG

## 2019-06-08 ENCOUNTER — Other Ambulatory Visit: Payer: Self-pay

## 2019-06-08 ENCOUNTER — Ambulatory Visit: Payer: BC Managed Care – PPO | Admitting: Gastroenterology

## 2019-06-08 ENCOUNTER — Encounter: Payer: Self-pay | Admitting: Gastroenterology

## 2019-06-08 VITALS — BP 114/76 | HR 100 | Temp 98.3°F | Ht 61.0 in | Wt 100.1 lb

## 2019-06-08 DIAGNOSIS — R1013 Epigastric pain: Secondary | ICD-10-CM

## 2019-06-08 DIAGNOSIS — R10814 Left lower quadrant abdominal tenderness: Secondary | ICD-10-CM

## 2019-06-08 MED ORDER — PANTOPRAZOLE SODIUM 20 MG PO TBEC: 20.0000 mg | DELAYED_RELEASE_TABLET | Freq: Every day | ORAL | 11 refills | Status: DC

## 2019-06-08 NOTE — Patient Instructions (Signed)
If you are age 59 or older, your body mass index should be between 23-30. Your Body mass index is 18.92 kg/m. If this is out of the aforementioned range listed, please consider follow up with your Primary Care Provider.  If you are age 30 or younger, your body mass index should be between 19-25. Your Body mass index is 18.92 kg/m. If this is out of the aformentioned range listed, please consider follow up with your Primary Care Provider.   You have been [facility] [place] [address] [facility]   You are scheduled on 06/17/19 at [place] should arrive 15 minutes prior to your appointment time for registration. Please follow the written instructions below on the day of your exam:  WARNING: IF YOU ARE ALLERGIC TO IODINE/X-RAY DYE, PLEASE NOTIFY RADIOLOGY IMMEDIATELY AT [phone]! YOU WILL BE GIVEN A 13 HOUR PREMEDICATION PREP.  1) Do not eat or drink anything after 6am (4 hours prior to your test) 2) You have been given 2 bottles of oral contrast to drink. The solution may taste better if refrigerated, but do NOT add ice or any other liquid to this solution. Shake well before drinking.    Drink 1 bottle of contrast @ 8am (2 hours prior to your exam)  Drink 1 bottle of contrast @ 9am (1 hour prior to your exam)  You may take any medications as prescribed with a small amount of water, if necessary. If you take any of the following medications: METFORMIN, GLUCOPHAGE, GLUCOVANCE, AVANDAMET, RIOMET, FORTAMET, [place] MET, JANUMET, GLUMETZA or METAGLIP, you MAY be asked to HOLD this medication 48 hours AFTER the exam.  The purpose of you drinking the oral contrast is to aid in the visualization of your intestinal tract. The contrast solution may cause some diarrhea. Depending on your individual set of symptoms, you may also receive an intravenous injection of x-ray contrast/dye. Plan on being at [facility] for 30 minutes or longer, depending on the type of exam you are having performed.  This test typically takes 30-45 minutes to complete.  If you have any questions regarding your exam or if you need to reschedule, you may call the CT department at [phone] between the hours of 8:00 am and 5:00 pm, Monday-Friday.  ________________________________________________________________________  Please go to the lab at [facility] ([place] You will need to go to level "B", you do not need an appointment for this. Hours available are 7:30 am - 4:30 pm.    Follow up in 12 weeks.   Thank you,  Dr. Jackquline Denmark

## 2019-06-08 NOTE — Progress Notes (Signed)
Chief Complaint: FU  Referring Provider:  Isaac Bliss, Estel*      ASSESSMENT AND PLAN;   #1.  Epigastric pain. Neg EGD 03/2018. Neg [place] 12/2016. D/d PUD, GERD, gastritis, nonulcer dyspepsia, gastroparesis, musculoskeletal etiology, r/o gallbladder or pancreatic problems.  #2.  Colorectal cancer screening. Neg colon 10/2011. Next due 10/2021  #3.  LLQ tenderness. ?  Etiology  Plan: -Trial of protonix 20mg  po qd x 4 weeks.. -Check CBC, CMP, lipase, TSH, celiac screen, B12 -Proceed with CT AP with p.o. and IV contrast. -FU in 12 weeks. -If still with problems, HIDA with EF/GES, followed by food allergy testing followed by hydrogen breath test if needed.   HPI:    Traci Houston is a 59 y.o. female  Epigastric pain with bloating.  No nausea, vomiting, regurgitation, odynophagia or dysphagia.  No significant diarrhea or constipation.  No melena or hematochezia. No unintentional weight loss.  Does give history of passing pellet-like stools.  Milk & cheese would make abdominal bloating worse.  She could not identify any other food intolerance.  Does have generalized malaise and occasional weakness.  Has been noticing that food would occasionally get hung up in the mid chest.  She does give history of some heartburn.  Had negative evaluation as above including EGD, colonoscopy, ultrasound.  B12 level was borderline.  Failed multiple medications including nortriptyline, IBgard, fiber.   Past Medical History:  Diagnosis Date  . Diastolic CHF ([place]) A999333   -mild on echo 2017 for murmur   . Heart murmur 09/08/2015   Echo 2017: -Normal LV systolic function; grade 1 diastolic dysfunction; trace AI; mild MR and TR.   Marland Kitchen Thyroid disease     Past Surgical History:  Procedure Laterality Date  . EYE SURGERY      Family History  Problem Relation Age of Onset  . Prostate cancer Father   . Heart disease Sister   . Colon cancer Neg Hx   . Breast cancer Neg Hx      Social History   Tobacco Use  . Smoking status: Never Smoker  . Smokeless tobacco: Never Used  Substance Use Topics  . Alcohol use: No  . Drug use: No    Current Outpatient Medications  Medication Sig Dispense Refill  . levothyroxine (SYNTHROID) 88 MCG tablet Take 1 tablet (88 mcg total) by mouth daily before breakfast. TAKE 1 TABLET BY MOUTH DAILY 90 tablet 2  . nortriptyline (PAMELOR) 10 MG capsule Take 1 capsule (10 mg total) by mouth at bedtime. 30 capsule 3   No current facility-administered medications for this visit.    No Known Allergies  Review of Systems:  Constitutional: Denies fever, chills, diaphoresis, appetite change and fatigue.  HEENT: Denies photophobia, eye pain, redness, hearing loss, ear pain, congestion, sore throat, rhinorrhea, sneezing, mouth sores, neck pain, neck stiffness and tinnitus.   Respiratory: Denies SOB, DOE, cough, chest tightness,  and wheezing.   Cardiovascular: Denies chest pain, palpitations and leg swelling.  Genitourinary: Denies dysuria, urgency, frequency, hematuria, flank pain and difficulty urinating.  Musculoskeletal: Denies myalgias, back pain, joint swelling, arthralgias and gait problem.  Skin: No rash.  Neurological: Denies dizziness, seizures, syncope, weakness, light-headedness, numbness and headaches.  Hematological: Denies adenopathy. Easy bruising, personal or family bleeding history  Psychiatric/Behavioral: Has anxiety or depression     Physical Exam:    LMP 08/10/2011  Wt Readings from Last 3 Encounters:  11/17/18 97 lb 3.2 oz (44.1 kg)  10/06/18 98 lb 6.4  oz (44.6 kg)  03/31/18 98 lb (44.5 kg)   Constitutional:  Well-developed, in no acute distress. Psychiatric: Normal mood and affect. Behavior is normal. HEENT: Pupils normal.  Conjunctivae are normal. No scleral icterus. Neck supple.  Cardiovascular: Normal rate, regular rhythm. No edema Pulmonary/chest: Effort normal and breath sounds normal. No  wheezing, rales or rhonchi. Abdominal: Soft, nondistended.  Epigastric and left lower quadrant abdominal tenderness bowel sounds active throughout. There are no masses palpable. No hepatomegaly. Rectal:  defered Neurological: Alert and oriented to person place and time. Skin: Skin is warm and dry. No rashes noted.  Data Reviewed: I have personally reviewed following labs and imaging studies  CBC: CBC Latest Ref Rng & Units 11/17/2018 12/03/2017 11/12/2017  WBC 4.0 - 10.5 K/uL 3.8(L) 4.4 4.6  Hemoglobin 12.0 - 15.0 g/dL 13.9 13.3 13.8  Hematocrit 36.0 - 46.0 % 41.7 41.8 41.5  Platelets 150.0 - 400.0 K/uL 300.0 328 284.0    CMP: CMP Latest Ref Rng & Units 11/17/2018 12/03/2017 11/12/2017  Glucose 70 - 99 mg/dL 77 77 90  BUN 6 - 23 mg/dL 11 9 10   Creatinine 0.40 - 1.20 mg/dL 0.58 0.61 0.68  Sodium 135 - 145 mEq/L 139 143 137  Potassium 3.5 - 5.1 mEq/L 3.9 4.2 4.1  Chloride 96 - 112 mEq/L 100 101 98  CO2 19 - 32 mEq/L 32 26 34(H)  Calcium 8.4 - 10.5 mg/dL 9.3 8.9 9.3  Total Protein 6.0 - 8.3 g/dL 7.3 7.0 7.4  Total Bilirubin 0.2 - 1.2 mg/dL 1.0 0.6 0.6  Alkaline Phos 39 - 117 U/L 48 53 43  AST 0 - 37 U/L 17 16 18   ALT 0 - 35 U/L 8 9 7       Carmell Austria, MD 06/08/2019, 10:29 AM  Cc: Isaac Bliss, Estel*

## 2019-06-10 ENCOUNTER — Other Ambulatory Visit (INDEPENDENT_AMBULATORY_CARE_PROVIDER_SITE_OTHER): Payer: BC Managed Care – PPO

## 2019-06-10 DIAGNOSIS — R10814 Left lower quadrant abdominal tenderness: Secondary | ICD-10-CM | POA: Diagnosis not present

## 2019-06-10 DIAGNOSIS — R1013 Epigastric pain: Secondary | ICD-10-CM | POA: Diagnosis not present

## 2019-06-10 LAB — CBC WITH DIFFERENTIAL/PLATELET
Basophils Absolute: 0 10*3/uL (ref 0.0–0.1)
Basophils Relative: 0.7 % (ref 0.0–3.0)
Eosinophils Absolute: 0.1 10*3/uL (ref 0.0–0.7)
Eosinophils Relative: 2.8 % (ref 0.0–5.0)
HCT: 43.1 % (ref 36.0–46.0)
Hemoglobin: 14 g/dL (ref 12.0–15.0)
Lymphocytes Relative: 36.4 % (ref 12.0–46.0)
Lymphs Abs: 1.5 10*3/uL (ref 0.7–4.0)
MCHC: 32.5 g/dL (ref 30.0–36.0)
MCV: 88.2 fl (ref 78.0–100.0)
Monocytes Absolute: 0.4 10*3/uL (ref 0.1–1.0)
Monocytes Relative: 10.8 % (ref 3.0–12.0)
Neutro Abs: 2 10*3/uL (ref 1.4–7.7)
Neutrophils Relative %: 49.3 % (ref 43.0–77.0)
Platelets: 339 10*3/uL (ref 150.0–400.0)
RBC: 4.89 Mil/uL (ref 3.87–5.11)
RDW: 13 % (ref 11.5–15.5)
WBC: 4 10*3/uL (ref 4.0–10.5)

## 2019-06-10 LAB — COMPREHENSIVE METABOLIC PANEL
ALT: 7 U/L (ref 0–35)
AST: 16 U/L (ref 0–37)
Albumin: 4.3 g/dL (ref 3.5–5.2)
Alkaline Phosphatase: 54 U/L (ref 39–117)
BUN: 8 mg/dL (ref 6–23)
CO2: 31 mEq/L (ref 19–32)
Calcium: 9.4 mg/dL (ref 8.4–10.5)
Chloride: 101 mEq/L (ref 96–112)
Creatinine, Ser: 0.59 mg/dL (ref 0.40–1.20)
GFR: 104.39 mL/min (ref >60.00)
Glucose, Bld: 86 mg/dL (ref 70–99)
Potassium: 3.5 mEq/L (ref 3.5–5.1)
Sodium: 138 mEq/L (ref 135–145)
Total Bilirubin: 0.6 mg/dL (ref 0.2–1.2)
Total Protein: 7.7 g/dL (ref 6.0–8.3)

## 2019-06-10 LAB — VITAMIN B12: Vitamin B-12: 275 pg/mL (ref 211–911)

## 2019-06-10 LAB — TSH: TSH: 0.16 u[IU]/mL — ABNORMAL LOW (ref 0.35–4.50)

## 2019-06-10 LAB — LIPASE: Lipase: 63 U/L — ABNORMAL HIGH (ref 11.0–59.0)

## 2019-06-12 LAB — CELIAC PANEL 10
Antigliadin Abs, IgA: 7 units (ref 0–19)
Endomysial IgA: NEGATIVE
Gliadin IgG: 2 units (ref 0–19)
IgA/Immunoglobulin A, Serum: 231 mg/dL (ref 87–352)
Tissue Transglut Ab: 3 U/mL (ref 0–5)
Transglutaminase IgA: <2 U/mL (ref 0–3)

## 2019-06-17 ENCOUNTER — Ambulatory Visit (HOSPITAL_BASED_OUTPATIENT_CLINIC_OR_DEPARTMENT_OTHER)
Admission: RE | Admit: 2019-06-17 | Discharge: 2019-06-17 | Disposition: A | Discharge status: Home / Self Care | Payer: BC Managed Care – PPO | Source: Ambulatory Visit | Attending: Gastroenterology | Admitting: Gastroenterology

## 2019-06-17 ENCOUNTER — Encounter (HOSPITAL_BASED_OUTPATIENT_CLINIC_OR_DEPARTMENT_OTHER): Payer: Self-pay

## 2019-06-17 ENCOUNTER — Other Ambulatory Visit: Payer: Self-pay

## 2019-06-17 DIAGNOSIS — R10814 Left lower quadrant abdominal tenderness: Secondary | ICD-10-CM

## 2019-06-17 DIAGNOSIS — D259 Leiomyoma of uterus, unspecified: Secondary | ICD-10-CM | POA: Diagnosis not present

## 2019-06-17 DIAGNOSIS — R1013 Epigastric pain: Secondary | ICD-10-CM | POA: Diagnosis not present

## 2019-06-17 MED ORDER — IOHEXOL 300 MG/ML  SOLN
100.0000 mL | Freq: Once | INTRAMUSCULAR | Status: AC | PRN
  Administered 2019-06-17: 100 mL via INTRAVENOUS

## 2019-06-23 ENCOUNTER — Other Ambulatory Visit: Payer: Self-pay

## 2019-06-23 NOTE — Progress Notes (Signed)
Encounter error

## 2019-06-29 ENCOUNTER — Telehealth: Payer: Self-pay | Admitting: Internal Medicine

## 2019-06-29 NOTE — Telephone Encounter (Signed)
Patient  Traci Houston her thyroid is low want to speak to the nurse . Pt said she has to go to work today at 2 .please call in the morning if your unable to call back before 2 pm  [phone]

## 2019-06-30 NOTE — Telephone Encounter (Signed)
Spoke with patient and an appointment scheduled 

## 2019-07-06 ENCOUNTER — Other Ambulatory Visit: Payer: Self-pay

## 2019-07-06 ENCOUNTER — Encounter: Payer: Self-pay | Admitting: Internal Medicine

## 2019-07-06 ENCOUNTER — Ambulatory Visit: Payer: BC Managed Care – PPO | Admitting: Internal Medicine

## 2019-07-06 VITALS — BP 98/68 | HR 80 | Temp 98.2°F | Wt 97.8 lb

## 2019-07-06 DIAGNOSIS — D259 Leiomyoma of uterus, unspecified: Secondary | ICD-10-CM | POA: Diagnosis not present

## 2019-07-06 DIAGNOSIS — E039 Hypothyroidism, unspecified: Secondary | ICD-10-CM | POA: Diagnosis not present

## 2019-07-06 MED ORDER — LEVOTHYROXINE SODIUM 75 MCG PO TABS: 75.0000 ug | ORAL_TABLET | Freq: Every day | ORAL | 1 refills | Status: DC

## 2019-07-06 NOTE — Patient Instructions (Signed)
-  Nice seeing you today!!  -Decrease thyroid medication to 75 mcg daily. STOP taking the 88 mcg.  -See you back in May for your physical.  -We will check on the status of your referral to the GYN.

## 2019-07-06 NOTE — Progress Notes (Signed)
Established Patient Office Visit     This visit occurred during the SARS-CoV-2 public health emergency.  Safety protocols were in place, including screening questions prior to the visit, additional usage of staff PPE, and extensive cleaning of exam room while observing appropriate contact time as indicated for disinfecting solutions.    CC/Reason for Visit: Follow-up thyroid  HPI: Traci Houston is a 59 y.o. female who is coming in today for the above mentioned reasons.  She is here today at the urging of her gastroenterologist.  She had some labs drawn at the beginning of April and was found to have a low TSH of 0.160.  She has been on Synthroid 88 mcg.  She feels like sometimes foods get stuck in her throat when attempting to swallow.  Past Medical/Surgical History: Past Medical History:  Diagnosis Date  . Diastolic CHF ([place]) A999333   -mild on echo 2017 for murmur   . Heart murmur 09/08/2015   Echo 2017: -Normal LV systolic function; grade 1 diastolic dysfunction; trace AI; mild MR and TR.   Marland Kitchen Thyroid disease     Past Surgical History:  Procedure Laterality Date  . COLONOSCOPY    . ESOPHAGOGASTRODUODENOSCOPY    . EYE SURGERY      Social History:  reports that she has never smoked. She has never used smokeless tobacco. She reports that she does not drink alcohol or use drugs.  Allergies: No Known Allergies  Family History:  Family History  Problem Relation Age of Onset  . Prostate cancer Father   . Heart disease Sister   . Colon cancer Neg Hx   . Breast cancer Neg Hx      Current Outpatient Medications:  .  levothyroxine (SYNTHROID) 75 MCG tablet, Take 1 tablet (75 mcg total) by mouth daily before breakfast. TAKE 1 TABLET BY MOUTH DAILY, Disp: 90 tablet, Rfl: 1 .  pantoprazole (PROTONIX) 20 MG tablet, Take 1 tablet (20 mg total) by mouth daily., Disp: 30 tablet, Rfl: 11  Review of Systems:  Constitutional: Denies fever, chills, diaphoresis, appetite  change and fatigue.  HEENT: Denies photophobia, eye pain, redness, hearing loss, ear pain, congestion, sore throat, rhinorrhea, sneezing, mouth sores, trouble swallowing, neck pain, neck stiffness and tinnitus.   Respiratory: Denies SOB, DOE, cough, chest tightness,  and wheezing.   Cardiovascular: Denies chest pain, palpitations and leg swelling.  Gastrointestinal: Denies nausea, vomiting, abdominal pain, diarrhea, constipation, blood in stool and abdominal distention.  Genitourinary: Denies dysuria, urgency, frequency, hematuria, flank pain and difficulty urinating.  Endocrine: Denies: hot or cold intolerance, sweats, changes in hair or nails, polyuria, polydipsia. Musculoskeletal: Denies myalgias, back pain, joint swelling, arthralgias and gait problem.  Skin: Denies pallor, rash and wound.  Neurological: Denies dizziness, seizures, syncope, weakness, light-headedness, numbness and headaches.  Hematological: Denies adenopathy. Easy bruising, personal or family bleeding history  Psychiatric/Behavioral: Denies suicidal ideation, mood changes, confusion, nervousness, sleep disturbance and agitation    Physical Exam: Vitals:   07/06/19 1001  BP: 98/68  Pulse: 80  Temp: 98.2 F (36.8 C)  TempSrc: Temporal  SpO2: 98%  Weight: 97 lb 12.8 oz (44.4 kg)    Body mass index is 18.48 kg/m.   Constitutional: NAD, calm, comfortable Eyes: PERRL, lids and conjunctivae normal ENMT: Mucous membranes are moist.  Respiratory: clear to auscultation bilaterally, no wheezing, no crackles. Normal respiratory effort. No accessory muscle use.  Cardiovascular: Regular rate and rhythm, no murmurs / rubs / gallops. No extremity edema. Neurologic:  Grossly intact and nonfocal Psychiatric: Normal judgment and insight. Alert and oriented x 3. Normal mood.    Impression and Plan:  Hypothyroidism, unspecified type  -TSH is over suppressed. -Decrease dose from 88 mcg to 75 mcg. -She will return in 6 weeks  for follow-up TSH.    Patient Instructions  -Nice seeing you today!!  -Decrease thyroid medication to 75 mcg daily. STOP taking the 88 mcg.  -See you back in May for your physical.  -We will check on the status of your referral to the GYN.     [name], MD [facility] at [facility]

## 2019-07-08 ENCOUNTER — Encounter: Payer: Self-pay | Admitting: Obstetrics and Gynecology

## 2019-07-20 ENCOUNTER — Ambulatory Visit: Payer: BC Managed Care – PPO | Admitting: Obstetrics and Gynecology

## 2019-07-20 ENCOUNTER — Encounter: Payer: Self-pay | Admitting: Obstetrics and Gynecology

## 2019-07-20 ENCOUNTER — Other Ambulatory Visit: Payer: Self-pay

## 2019-07-20 VITALS — BP 100/70 | HR 80 | Temp 96.7°F | Resp 18 | Ht 61.0 in | Wt 95.8 lb

## 2019-07-20 DIAGNOSIS — D259 Leiomyoma of uterus, unspecified: Secondary | ICD-10-CM | POA: Diagnosis not present

## 2019-07-20 NOTE — Progress Notes (Signed)
GYNECOLOGY  VISIT  REF BY: Dr. [name]   HPI: 59 y.o.   Married  [place]  female   [id number] with Patient's last menstrual period was 08/10/2011.   here for evaluation of abnormal CT Abd/Pelvis revealing uterine fibroid.  Patient is here with a female [place] interpretor for the discussion portion of the visit.   Denies vaginal bleeding.  No HRT.  She reports symptoms of pelvic pain in the past, not now.  No bladder pressure.  She empties her bladder two times a day if she drinks a lot of water.  She has low back pain this year.   Her CT scan showed the following: CLINICAL DATA:  Epigastric pain and lightheadedness  EXAM: CT ABDOMEN AND PELVIS WITH CONTRAST  TECHNIQUE: Multidetector CT imaging of the abdomen and pelvis was performed using the standard protocol following bolus administration of intravenous contrast.  CONTRAST:  130mL OMNIPAQUE IOHEXOL 300 MG/ML  SOLN  COMPARISON:  Abdominal sonogram from 2018  FINDINGS: Lower chest: Mild basilar atelectasis. No pleural or pericardial effusion.  Hepatobiliary: No focal, suspicious hepatic lesion. Portal vein is patent. Gallbladder without signs of pericholecystic inflammation. No biliary ductal dilation.  Pancreas: Pancreas is normal without focal lesion, inflammation or ductal distension.  Spleen: Spleen is normal size without focal lesion.  Adrenals/Urinary Tract: Adrenal glands are normal.  Kidneys with smooth contours and symmetric enhancement. No suspicious renal lesion.  Urinary bladder is unremarkable though under distended limiting assessment.  Stomach/Bowel: Question mild thickening of the stomach versus under distension. No perigastric stranding. Mild circumferential thickening also of distal esophagus.  No acute small bowel process.  Colon is unremarkable. The appendix is normal.  Vascular/Lymphatic: Vascular structures in the abdomen are patent. No adenopathy in  the retroperitoneum or in the upper abdomen.  No pelvic adenopathy.  Reproductive: 5.3 x 5.2 cm leiomyoma in the anterior uterine fundus and body. No adnexal mass.  Other: No abdominal wall hernia or abnormality. No abdominopelvic ascites.  Musculoskeletal: No acute bone finding or destructive bone process.  IMPRESSION: 1. Question mild thickening of the stomach versus under distension. Mild circumferential thickening also of the distal esophagus. Findings may represent esophagitis/gastritis. 2. 5.3 cm uterine leiomyoma. Follow-up sonogram as warranted based on symptoms.   Electronically Signed   By: Zetta Bills M.D.   On: 06/17/2019 14:26  GYNECOLOGIC HISTORY: Patient's last menstrual period was 08/10/2011. Contraception:  PMP Menopausal hormone therapy:  n/a Last mammogram: 09-23-18 3D/Neg/density D/BiRads1 Last pap smear:03-18-16 Neg, 07-09-13 Neg, 07-10-11 Neg           OB History    Gravida  2   Para  2   Term      Preterm      AB      Living  2     SAB      TAB      Ectopic      Multiple      Live Births                 Patient Active Problem List   Diagnosis Date Noted  . Vitamin D deficiency 11/17/2018  . Atypical chest pain 12/18/2017  . Abdominal pain, epigastric 12/18/2017  . Upper back pain 12/18/2017  . Chronic nonintractable headache 12/18/2017  . Mitral valve prolapse 03/18/2016  . Hypothyroidism 06/27/2008    Past Medical History:  Diagnosis Date  . Diastolic CHF ([place]) [id number]   -mild on echo 2017 for murmur   . Heart  murmur 09/08/2015   Echo 2017: -Normal LV systolic function; grade 1 diastolic dysfunction; trace AI; mild MR and TR.   Marland Kitchen Thyroid disease     Past Surgical History:  Procedure Laterality Date  . COLONOSCOPY    . ESOPHAGOGASTRODUODENOSCOPY    . EYE SURGERY      Current Outpatient Medications  Medication Sig Dispense Refill  . levothyroxine (SYNTHROID) 75 MCG tablet Take 1 tablet (75 mcg total)  by mouth daily before breakfast. TAKE 1 TABLET BY MOUTH DAILY 90 tablet 1  . pantoprazole (PROTONIX) 20 MG tablet Take 1 tablet (20 mg total) by mouth daily. (Patient not taking: Reported on 07/20/2019) 30 tablet 11   No current facility-administered medications for this visit.     ALLERGIES: Patient has no known allergies.  Family History  Problem Relation Age of Onset  . Prostate cancer Father   . Heart disease Sister   . Colon cancer Neg Hx   . Breast cancer Neg Hx     Social History   Socioeconomic History  . Marital status: Married    Spouse name: Not on file  . Number of children: 2  . Years of education: Not on file  . Highest education level: Not on file  Occupational History    Employer: [employer]  Tobacco Use  . Smoking status: Never Smoker  . Smokeless tobacco: Never Used  Substance and Sexual Activity  . Alcohol use: No  . Drug use: No  . Sexual activity: Not Currently    Birth control/protection: Post-menopausal  Other Topics Concern  . Not on file  Social History Narrative   Patient is right-handed. She lives in a one level home, her daughter lives with her. She does not drink caffeine. She does not exercise, but states is active at work.   Social Determinants of Health   Financial Resource Strain:   . Difficulty of Paying Living Expenses:   Food Insecurity:   . Worried About Charity fundraiser in the Last Year:   . Arboriculturist in the Last Year:   Transportation Needs:   . Film/video editor (Medical):   Marland Kitchen Lack of Transportation (Non-Medical):   Physical Activity:   . Days of Exercise per Week:   . Minutes of Exercise per Session:   Stress:   . Feeling of Stress :   Social Connections:   . Frequency of Communication with Friends and Family:   . Frequency of Social Gatherings with Friends and Family:   . Attends Religious Services:   . Active Member of Clubs or Organizations:   . Attends Archivist Meetings:   Marland Kitchen Marital  Status:   Intimate Partner Violence:   . Fear of Current or Ex-Partner:   . Emotionally Abused:   Marland Kitchen Physically Abused:   . Sexually Abused:     Review of Systems  All other systems reviewed and are negative.   PHYSICAL EXAMINATION:    BP 100/70   Pulse 80   Temp (!) 96.7 F (35.9 C) (Temporal)   Resp 18   Ht 5\' 1"  (1.549 m)   Wt 95 lb 12.8 oz (43.5 kg)   LMP 08/10/2011   BMI 18.10 kg/m     General appearance: alert, cooperative and appears stated age Head: Normocephalic, without obvious abnormality, atraumatic Lungs: clear to auscultation bilaterally Heart: regular rate and rhythm Abdomen: soft, non-tender, no masses,  no organomegaly Extremities: extremities normal, atraumatic, no cyanosis or edema No abnormal inguinal nodes  palpated Neurologic: Grossly normal  Pelvic: External genitalia:  no lesions              Urethra:  normal appearing urethra with no masses, tenderness or lesions              Bartholins and Skenes: normal                 Vagina: normal appearing vagina with normal color and discharge, no lesions              Cervix: no lesions                Bimanual Exam:  Uterus:  normal size, contour, position, consistency, mobility, non-tender              Adnexa: no mass, fullness, tenderness              Rectal exam: Yes.  .  Confirms.  Posterior uterine fullness noted.               Anus:  normal sphincter tone, no lesions  Chaperone was present for exam.  ASSESSMENT  Uterine fibroid noted on CT scan.    PLAN  We discussed fibroids, their benign nature and common finding in women.  CT scan reviewed.  She will return for pelvic US.  Hopefully, we will be able to do observational management and follow the fibroid as it is a new dx.    An After Visit Summary was printed and given to the patient.

## 2019-07-20 NOTE — Patient Instructions (Signed)

## 2019-07-27 ENCOUNTER — Other Ambulatory Visit: Payer: Self-pay | Admitting: Internal Medicine

## 2019-07-27 DIAGNOSIS — Z1231 Encounter for screening mammogram for malignant neoplasm of breast: Secondary | ICD-10-CM

## 2019-07-28 ENCOUNTER — Encounter: Payer: Self-pay | Admitting: Internal Medicine

## 2019-07-28 ENCOUNTER — Other Ambulatory Visit: Payer: Self-pay

## 2019-07-28 ENCOUNTER — Ambulatory Visit (INDEPENDENT_AMBULATORY_CARE_PROVIDER_SITE_OTHER): Payer: BC Managed Care – PPO | Admitting: Internal Medicine

## 2019-07-28 VITALS — BP 100/68 | HR 76 | Temp 97.1°F | Ht 61.0 in | Wt 96.2 lb

## 2019-07-28 DIAGNOSIS — R221 Localized swelling, mass and lump, neck: Secondary | ICD-10-CM | POA: Diagnosis not present

## 2019-07-28 DIAGNOSIS — E039 Hypothyroidism, unspecified: Secondary | ICD-10-CM

## 2019-07-28 NOTE — Progress Notes (Signed)
Established Patient Office Visit     This visit occurred during the SARS-CoV-2 public health emergency.  Safety protocols were in place, including screening questions prior to the visit, additional usage of staff PPE, and extensive cleaning of exam room while observing appropriate contact time as indicated for disinfecting solutions.    CC/Reason for Visit: Difficulty swallowing  HPI: Traci Houston is a 59 y.o. female who is coming in today for the above mentioned reasons.  I last saw her on April 27 after she was referred to me from her gynecologist due to an over suppressed TSH.  At that time her TSH was 0.160 on 88 mcg of Synthroid.  She has a history of hypothyroidism.  Her Synthroid was decreased to 75 mcg and she was supposed to come back in 6 weeks for follow-up.  Today is week 3.  She decided to schedule an appointment today because she continues to have difficulty swallowing, states food gets stuck in her throat, she thinks this might be due to her thyroid.   Past Medical/Surgical History: Past Medical History:  Diagnosis Date  . Diastolic CHF ([place]) A999333   -mild on echo 2017 for murmur   . Heart murmur 09/08/2015   Echo 2017: -Normal LV systolic function; grade 1 diastolic dysfunction; trace AI; mild MR and TR.   Marland Kitchen Thyroid disease     Past Surgical History:  Procedure Laterality Date  . COLONOSCOPY    . ESOPHAGOGASTRODUODENOSCOPY    . EYE SURGERY      Social History:  reports that she has never smoked. She has never used smokeless tobacco. She reports that she does not drink alcohol or use drugs.  Allergies: No Known Allergies  Family History:  Family History  Problem Relation Age of Onset  . Prostate cancer Father   . Heart disease Sister   . Colon cancer Neg Hx   . Breast cancer Neg Hx      Current Outpatient Medications:  .  levothyroxine (SYNTHROID) 75 MCG tablet, Take 1 tablet (75 mcg total) by mouth daily before breakfast. TAKE 1 TABLET BY  MOUTH DAILY, Disp: 90 tablet, Rfl: 1 .  pantoprazole (PROTONIX) 20 MG tablet, Take 1 tablet (20 mg total) by mouth daily., Disp: 30 tablet, Rfl: 11  Review of Systems:  Constitutional: Denies fever, chills, diaphoresis, appetite change and fatigue.  HEENT: Denies photophobia, eye pain, redness, hearing loss, ear pain, congestion, sore throat, rhinorrhea, sneezing, mouth sores, neck stiffness and tinnitus.   Respiratory: Denies SOB, DOE, cough, chest tightness,  and wheezing.   Cardiovascular: Denies chest pain, palpitations and leg swelling.  Gastrointestinal: Denies nausea, vomiting, abdominal pain, diarrhea, constipation, blood in stool and abdominal distention.  Genitourinary: Denies dysuria, urgency, frequency, hematuria, flank pain and difficulty urinating.  Endocrine: Denies: hot or cold intolerance, sweats, changes in hair or nails, polyuria, polydipsia. Musculoskeletal: Denies myalgias, back pain, joint swelling, arthralgias and gait problem.  Skin: Denies pallor, rash and wound.  Neurological: Denies dizziness, seizures, syncope, weakness, light-headedness, numbness and headaches.  Hematological: Denies adenopathy. Easy bruising, personal or family bleeding history  Psychiatric/Behavioral: Denies suicidal ideation, mood changes, confusion, nervousness, sleep disturbance and agitation    Physical Exam: Vitals:   07/28/19 1056  BP: 100/68  Pulse: 76  Temp: (!) 97.1 F (36.2 C)  TempSrc: Temporal  SpO2: 98%  Weight: 96 lb 3.2 oz (43.6 kg)  Height: 5\' 1"  (1.549 m)    Body mass index is 18.18 kg/m.  Constitutional: NAD, calm, comfortable Eyes: PERRL, lids and conjunctivae normal ENMT: Mucous membranes are moist.  Neck: normal, supple, she does have neck fullness. Respiratory: clear to auscultation bilaterally, no wheezing, no crackles. Normal respiratory effort. No accessory muscle use.  Cardiovascular: Regular rate and rhythm, no murmurs / rubs / gallops. No extremity  edema.   Neurologic: Grossly intact and nonfocal Psychiatric: Normal judgment and insight. Alert and oriented x 3. Normal mood.    Impression and Plan:  Hypothyroidism, unspecified type Neck fullness   -She was recently found to have an over suppressed TSH and Synthroid dose was reduced to 75 mcg. -It is yet too early to recheck TSH, will make a lab appointment for her in 3 weeks. -I wonder if her difficulty swallowing given her neck fullness might be due to compressive symptoms from an enlarged thyroid gland, will order thyroid ultrasound. -If this is normal consider GI referral for possibility of esophageal dilatation and work-up of dysphagia.    Patient Instructions  -Nice seeing you today!!  -Come back in 3 weeks to recheck your thyroid levels.  -Thyroid ultrasound will be ordered today.     [name], MD [facility] at [facility]

## 2019-07-28 NOTE — Patient Instructions (Signed)
-  Nice seeing you today!!  -Come back in 3 weeks to recheck your thyroid levels.  -Thyroid ultrasound will be ordered today.

## 2019-07-30 ENCOUNTER — Telehealth: Payer: Self-pay | Admitting: Obstetrics and Gynecology

## 2019-07-30 NOTE — Telephone Encounter (Signed)
Spoke with pt's daughter ,[place] per [place]. Pt had question with filling bladder before PUS. Daughter given instructions for PUS. Agreeable and Verbalized understanding. Pt has PUS scheduled 08/05/19 at 1030 am.  Routing to Dr Quincy Simmonds for [facility].  Encounter closed.

## 2019-07-30 NOTE — Telephone Encounter (Signed)
Patient's daughter Traci Houston is calling with questions regarding her mother's PUS next week. Dpr on file to talk with Traci Winfield.

## 2019-08-04 ENCOUNTER — Other Ambulatory Visit: Payer: Self-pay

## 2019-08-05 ENCOUNTER — Encounter: Payer: Self-pay | Admitting: Obstetrics and Gynecology

## 2019-08-05 ENCOUNTER — Ambulatory Visit (INDEPENDENT_AMBULATORY_CARE_PROVIDER_SITE_OTHER): Payer: BC Managed Care – PPO

## 2019-08-05 ENCOUNTER — Ambulatory Visit: Payer: BC Managed Care – PPO | Admitting: Obstetrics and Gynecology

## 2019-08-05 VITALS — BP 100/60 | HR 80 | Temp 97.2°F | Ht 61.0 in | Wt 99.4 lb

## 2019-08-05 DIAGNOSIS — D259 Leiomyoma of uterus, unspecified: Secondary | ICD-10-CM

## 2019-08-05 NOTE — Progress Notes (Signed)
GYNECOLOGY  VISIT   HPI: 59 y.o.   Married  [place]  female   G2P2 with Patient's last menstrual period was 08/10/2011.   here for pelvic ultrasound to follow up on 5.3 x 5.2 cm uterine fibroid noted on CT scan performed on 06/17/19 for epigasrtic pain.   Interpretor is present for the visit today.  No hx of fibroids.  Before menopause, she had heavy menstruation.   She denies any vaginal bleeding or spotting since menopause.  No pain.  Sometimes uncomfortable in her lower pelvis when her bladder is full.  Emptying her bladder relieves her discomfort.  GYNECOLOGIC HISTORY: Patient's last menstrual period was 08/10/2011. Contraception: PMP Menopausal hormone therapy:  none Last mammogram: 09-23-18 3D/Neg/density D/BiRads1  Last pap smear: 03-18-16 Neg, 07-09-13 Neg, 07-10-11 Neg           OB History    Gravida  2   Para  2   Term      Preterm      AB      Living  2     SAB      TAB      Ectopic      Multiple      Live Births                 Patient Active Problem List   Diagnosis Date Noted  . Vitamin D deficiency 11/17/2018  . Atypical chest pain 12/18/2017  . Abdominal pain, epigastric 12/18/2017  . Upper back pain 12/18/2017  . Chronic nonintractable headache 12/18/2017  . Mitral valve prolapse 03/18/2016  . Hypothyroidism 06/27/2008    Past Medical History:  Diagnosis Date  . Diastolic CHF ([place]) [id number]   -mild on echo 2017 for murmur   . Heart murmur 09/08/2015   Echo 2017: -Normal LV systolic function; grade 1 diastolic dysfunction; trace AI; mild MR and TR.   Marland Kitchen Thyroid disease     Past Surgical History:  Procedure Laterality Date  . COLONOSCOPY    . ESOPHAGOGASTRODUODENOSCOPY    . EYE SURGERY      Current Outpatient Medications  Medication Sig Dispense Refill  . levothyroxine (SYNTHROID) 75 MCG tablet Take 1 tablet (75 mcg total) by mouth daily before breakfast. TAKE 1 TABLET BY MOUTH DAILY 90 tablet 1  . pantoprazole (PROTONIX) 20  MG tablet Take 1 tablet (20 mg total) by mouth daily. 30 tablet 11   No current facility-administered medications for this visit.     ALLERGIES: Patient has no known allergies.  Family History  Problem Relation Age of Onset  . Prostate cancer Father   . Heart disease Sister   . Colon cancer Neg Hx   . Breast cancer Neg Hx     Social History   Socioeconomic History  . Marital status: Married    Spouse name: Not on file  . Number of children: 2  . Years of education: Not on file  . Highest education level: Not on file  Occupational History    Employer: [employer]  Tobacco Use  . Smoking status: Never Smoker  . Smokeless tobacco: Never Used  Substance and Sexual Activity  . Alcohol use: No  . Drug use: No  . Sexual activity: Not Currently    Birth control/protection: Post-menopausal  Other Topics Concern  . Not on file  Social History Narrative   Patient is right-handed. She lives in a one level home, her daughter lives with her. She does not drink caffeine. She does not  exercise, but states is active at work.   Social Determinants of Health   Financial Resource Strain:   . Difficulty of Paying Living Expenses:   Food Insecurity:   . Worried About Charity fundraiser in the Last Year:   . Arboriculturist in the Last Year:   Transportation Needs:   . Film/video editor (Medical):   Marland Kitchen Lack of Transportation (Non-Medical):   Physical Activity:   . Days of Exercise per Week:   . Minutes of Exercise per Session:   Stress:   . Feeling of Stress :   Social Connections:   . Frequency of Communication with Friends and Family:   . Frequency of Social Gatherings with Friends and Family:   . Attends Religious Services:   . Active Member of Clubs or Organizations:   . Attends Archivist Meetings:   Marland Kitchen Marital Status:   Intimate Partner Violence:   . Fear of Current or Ex-Partner:   . Emotionally Abused:   Marland Kitchen Physically Abused:   . Sexually Abused:      Review of Systems  All other systems reviewed and are negative.   PHYSICAL EXAMINATION:    BP 100/60   Pulse 80   Temp (!) 97.2 F (36.2 C) (Temporal)   Ht 5\' 1"  (1.549 m)   Wt 99 lb 6.4 oz (45.1 kg)   LMP 08/10/2011   BMI 18.78 kg/m     General appearance: alert, cooperative and appears stated age   Pelvic US Uterus with 50 x 55 mm anterior intramural fibroid. EMS 1.93 mm.  Ovaries normal.  No adnexal masses.  No free fluid.   ASSESSMENT  Uterine fibroid.  Stable in size since CT scan on 06/17/19.  PLAN  We discussed her fibroid - location, symptoms related to fibroids, rare risk of sarcoma. I am not recommending surgical care at this time. FU in 3 months for repeat US to document stability of the fibroid.  If increasing in size, will get MRI of pelvis.  Call for bleeding or pain.    An After Visit Summary was printed and given to the patient.

## 2019-08-05 NOTE — Patient Instructions (Signed)

## 2019-08-17 ENCOUNTER — Telehealth: Payer: Self-pay | Admitting: *Deleted

## 2019-08-17 ENCOUNTER — Other Ambulatory Visit (INDEPENDENT_AMBULATORY_CARE_PROVIDER_SITE_OTHER): Payer: BC Managed Care – PPO

## 2019-08-17 ENCOUNTER — Other Ambulatory Visit: Payer: BC Managed Care – PPO

## 2019-08-17 DIAGNOSIS — E039 Hypothyroidism, unspecified: Secondary | ICD-10-CM

## 2019-08-17 LAB — TSH: TSH: 0.42 u[IU]/mL (ref 0.35–4.50)

## 2019-08-17 NOTE — Telephone Encounter (Signed)
Kenney Houseman stated the pt called to schedule a lab appt for repeat thyroid testing and is going to the [place] lab.  Per PCP orders were entered.

## 2019-08-30 ENCOUNTER — Ambulatory Visit
Admission: RE | Admit: 2019-08-30 | Discharge: 2019-08-30 | Disposition: A | Discharge status: Home / Self Care | Payer: BC Managed Care – PPO | Source: Ambulatory Visit | Attending: Internal Medicine | Admitting: Internal Medicine

## 2019-08-30 DIAGNOSIS — R221 Localized swelling, mass and lump, neck: Secondary | ICD-10-CM

## 2019-08-30 DIAGNOSIS — E079 Disorder of thyroid, unspecified: Secondary | ICD-10-CM | POA: Diagnosis not present

## 2019-09-24 ENCOUNTER — Other Ambulatory Visit: Payer: Self-pay

## 2019-09-24 ENCOUNTER — Ambulatory Visit
Admission: RE | Admit: 2019-09-24 | Discharge: 2019-09-24 | Disposition: A | Discharge status: Home / Self Care | Payer: BC Managed Care – PPO | Source: Ambulatory Visit | Attending: Internal Medicine | Admitting: Internal Medicine

## 2019-09-24 DIAGNOSIS — Z1231 Encounter for screening mammogram for malignant neoplasm of breast: Secondary | ICD-10-CM

## 2019-11-03 ENCOUNTER — Telehealth: Payer: Self-pay | Admitting: Obstetrics and Gynecology

## 2019-11-03 NOTE — Telephone Encounter (Signed)
Spoke with patient.  Patient is scheduled for PUS on 11/04/19 for uterine fibroids.  Advised patient she does not need to arrive with full bladder.  Questions answered. Patient verbalizes understanding.  Encounter closed.

## 2019-11-03 NOTE — Telephone Encounter (Signed)
Patient was called to confirm ultrasound appointment and want to know if she should come in with a full bladder.

## 2019-11-04 ENCOUNTER — Telehealth: Payer: Self-pay

## 2019-11-04 ENCOUNTER — Other Ambulatory Visit: Payer: Self-pay

## 2019-11-04 ENCOUNTER — Ambulatory Visit: Payer: BC Managed Care – PPO | Admitting: Obstetrics and Gynecology

## 2019-11-04 ENCOUNTER — Ambulatory Visit (INDEPENDENT_AMBULATORY_CARE_PROVIDER_SITE_OTHER): Payer: BC Managed Care – PPO

## 2019-11-04 ENCOUNTER — Encounter: Payer: Self-pay | Admitting: Obstetrics and Gynecology

## 2019-11-04 VITALS — BP 94/60 | HR 70 | Ht 61.0 in | Wt 99.0 lb

## 2019-11-04 DIAGNOSIS — D259 Leiomyoma of uterus, unspecified: Secondary | ICD-10-CM

## 2019-11-04 NOTE — Telephone Encounter (Signed)
Dr. Quincy Simmonds would like to see patient back in 3 months for AEX around November. Patient stated she would like a morning appointment, need triage to assist.

## 2019-11-04 NOTE — Progress Notes (Signed)
GYNECOLOGY  VISIT   HPI: 59 y.o.   Married  [place]  female   G2P2 with Patient's last menstrual period was 08/10/2011.   here for pelvic ultrasound for follow up a fibroid, first noted on CT scan 06/17/19 performed for epigastric pain and lightheadedness.  The CT showed a 5.3 cm fundal fibroid.    Her first pelvic US 5/27/21showed a 50 x 55 mm anterior intramural fibroid, EMS 1.93 mm, and normal ovaries.   She denies pelvic pain and bleeding.   Has mid back pain.   GYNECOLOGIC HISTORY: Patient's last menstrual period was 08/10/2011. Contraception: PMP Menopausal hormone therapy:  none Last mammogram: 09-24-19 3D/Neg/density D/Birads1 Last pap smear:03-18-16 Neg, 07-09-13 Neg, 07-10-11 Neg        OB History    Gravida  2   Para  2   Term      Preterm      AB      Living  2     SAB      TAB      Ectopic      Multiple      Live Births                 Patient Active Problem List   Diagnosis Date Noted  . Vitamin D deficiency 11/17/2018  . Atypical chest pain 12/18/2017  . Abdominal pain, epigastric 12/18/2017  . Upper back pain 12/18/2017  . Chronic nonintractable headache 12/18/2017  . Mitral valve prolapse 03/18/2016  . Hypothyroidism 06/27/2008    Past Medical History:  Diagnosis Date  . Diastolic CHF ([place]) 5/32/9924   -mild on echo 2017 for murmur   . Heart murmur 09/08/2015   Echo 2017: -Normal LV systolic function; grade 1 diastolic dysfunction; trace AI; mild MR and TR.   Marland Kitchen Thyroid disease     Past Surgical History:  Procedure Laterality Date  . COLONOSCOPY    . ESOPHAGOGASTRODUODENOSCOPY    . EYE SURGERY      Current Outpatient Medications  Medication Sig Dispense Refill  . levothyroxine (SYNTHROID) 75 MCG tablet Take 1 tablet (75 mcg total) by mouth daily before breakfast. TAKE 1 TABLET BY MOUTH DAILY 90 tablet 1   No current facility-administered medications for this visit.     ALLERGIES: Patient has no known  allergies.  Family History  Problem Relation Age of Onset  . Prostate cancer Father   . Heart disease Sister   . Colon cancer Neg Hx   . Breast cancer Neg Hx     Social History   Socioeconomic History  . Marital status: Married    Spouse name: Not on file  . Number of children: 2  . Years of education: Not on file  . Highest education level: Not on file  Occupational History    Employer: [employer]  Tobacco Use  . Smoking status: Never Smoker  . Smokeless tobacco: Never Used  Vaping Use  . Vaping Use: Never used  Substance and Sexual Activity  . Alcohol use: No  . Drug use: No  . Sexual activity: Not Currently    Birth control/protection: Post-menopausal  Other Topics Concern  . Not on file  Social History Narrative   Patient is right-handed. She lives in a one level home, her daughter lives with her. She does not drink caffeine. She does not exercise, but states is active at work.   Social Determinants of Health   Financial Resource Strain:   . Difficulty of Paying Living Expenses:  Not on file  Food Insecurity:   . Worried About Charity fundraiser in the Last Year: Not on file  . Ran Out of Food in the Last Year: Not on file  Transportation Needs:   . Lack of Transportation (Medical): Not on file  . Lack of Transportation (Non-Medical): Not on file  Physical Activity:   . Days of Exercise per Week: Not on file  . Minutes of Exercise per Session: Not on file  Stress:   . Feeling of Stress : Not on file  Social Connections:   . Frequency of Communication with Friends and Family: Not on file  . Frequency of Social Gatherings with Friends and Family: Not on file  . Attends Religious Services: Not on file  . Active Member of Clubs or Organizations: Not on file  . Attends Archivist Meetings: Not on file  . Marital Status: Not on file  Intimate Partner Violence:   . Fear of Current or Ex-Partner: Not on file  . Emotionally Abused: Not on file  .  Physically Abused: Not on file  . Sexually Abused: Not on file    Review of Systems  All other systems reviewed and are negative.   PHYSICAL EXAMINATION:    BP 94/60   Pulse 70   Ht 5\' 1"  (1.549 m)   Wt 99 lb (44.9 kg)   LMP 08/10/2011   BMI 18.71 kg/m     General appearance: alert, cooperative and appears stated age   Pelvic US Uterus with stable central fibroid, essentially unchanged. EMS 2.8 mm.  Ovaries atrophic.              ASSESSMENT  Stable solitary uterine fibroid, asymptomatic.  I doubt that this is contributing to her back pain.    PLAN  Ultrasound findings and images reviewed. Return for annual exam in 3 months.  Next pelvic US in 6 months.

## 2019-11-04 NOTE — Telephone Encounter (Signed)
Spoke with pt. Pt states needing 3 month follow up with AEX in November. Pt scheduled with Dr Quincy Simmonds on 01/20/20 at 33 am. Pt agreeable and verbalized understanding to date and time of appt.  Encounter closed.

## 2019-11-19 ENCOUNTER — Other Ambulatory Visit (INDEPENDENT_AMBULATORY_CARE_PROVIDER_SITE_OTHER): Payer: BC Managed Care – PPO

## 2019-11-19 DIAGNOSIS — E039 Hypothyroidism, unspecified: Secondary | ICD-10-CM

## 2019-11-19 LAB — TSH: TSH: 2.17 u[IU]/mL (ref 0.35–4.50)

## 2019-11-23 ENCOUNTER — Other Ambulatory Visit: Payer: Self-pay | Admitting: Internal Medicine

## 2019-11-23 DIAGNOSIS — E039 Hypothyroidism, unspecified: Secondary | ICD-10-CM

## 2019-11-23 MED ORDER — LEVOTHYROXINE SODIUM 75 MCG PO TABS: 75.0000 ug | ORAL_TABLET | Freq: Every day | ORAL | 3 refills | Status: DC

## 2019-12-07 ENCOUNTER — Other Ambulatory Visit: Payer: Self-pay | Admitting: Internal Medicine

## 2019-12-07 DIAGNOSIS — E039 Hypothyroidism, unspecified: Secondary | ICD-10-CM

## 2019-12-16 ENCOUNTER — Encounter: Payer: Self-pay | Admitting: Gastroenterology

## 2019-12-16 ENCOUNTER — Ambulatory Visit: Payer: BC Managed Care – PPO | Admitting: Gastroenterology

## 2019-12-16 VITALS — BP 90/62 | HR 82 | Ht 60.0 in | Wt 96.5 lb

## 2019-12-16 DIAGNOSIS — R112 Nausea with vomiting, unspecified: Secondary | ICD-10-CM

## 2019-12-16 DIAGNOSIS — R1013 Epigastric pain: Secondary | ICD-10-CM | POA: Diagnosis not present

## 2019-12-16 MED ORDER — PANTOPRAZOLE SODIUM 40 MG PO TBEC
40.0000 mg | DELAYED_RELEASE_TABLET | Freq: Every day | ORAL | 3 refills | Status: DC
Start: 2019-12-16 — End: 2020-05-24

## 2019-12-16 NOTE — Patient Instructions (Signed)
If you are age 59 or older, your body mass index should be between 23-30. Your Body mass index is 18.85 kg/m. If this is out of the aforementioned range listed, please consider follow up with your Primary Care Provider.  If you are age 23 or younger, your body mass index should be between 19-25. Your Body mass index is 18.85 kg/m. If this is out of the aformentioned range listed, please consider follow up with your Primary Care Provider.   You have been scheduled for a HIDA scan at [facility] (1st floor) on 12/28/19. Please arrive 15 minutes prior to your scheduled appointment at  1:61WR. Make certain not to have anything to eat or drink at least 6 hours prior to your test. Should this appointment date or time not work well for you, please call radiology scheduling at [phone].  _____________________________________________________________________ hepatobiliary (HIDA) scan is an imaging procedure used to diagnose problems in the liver, gallbladder and bile ducts. In the HIDA scan, a radioactive chemical or tracer is injected into a vein in your arm. The tracer is handled by the liver like bile. Bile is a fluid produced and excreted by your liver that helps your digestive system break down fats in the foods you eat. Bile is stored in your gallbladder and the gallbladder releases the bile when you eat a meal. A special nuclear medicine scanner (gamma camera) tracks the flow of the tracer from your liver into your gallbladder and small intestine.  During your HIDA scan  You'll be asked to change into a hospital gown before your HIDA scan begins. Your health care team will position you on a table, usually on your back. The radioactive tracer is then injected into a vein in your arm.The tracer travels through your bloodstream to your liver, where it's taken up by the bile-producing cells. The radioactive tracer travels with the bile from your liver into your gallbladder and through your bile  ducts to your small intestine.You may feel some pressure while the radioactive tracer is injected into your vein. As you lie on the table, a special gamma camera is positioned over your abdomen taking pictures of the tracer as it moves through your body. The gamma camera takes pictures continually for about an hour. You'll need to keep still during the HIDA scan. This can become uncomfortable, but you may find that you can lessen the discomfort by taking deep breaths and thinking about other things. Tell your health care team if you're uncomfortable. The radiologist will watch on a computer the progress of the radioactive tracer through your body. The HIDA scan may be stopped when the radioactive tracer is seen in the gallbladder and enters your small intestine. This typically takes about an hour. In some cases extra imaging will be performed if original images aren't satisfactory, if morphine is given to help visualize the gallbladder or if the medication CCK is given to look at the contraction of the gallbladder. This test typically takes 2 hours to complete. ________________________________________________________________________   We have sent the following medications to your pharmacy for you to pick up at your convenience: Protonix   Follow up 12 weeks  Thank you,  Dr. Jackquline Denmark

## 2019-12-16 NOTE — Progress Notes (Signed)
Chief Complaint: FU  Referring Provider:  Isaac Bliss, Estel*      ASSESSMENT AND PLAN;   #1.  Epigastric pain with atypical chest pain and associated anxiety. Neg EGD 03/2018. Neg [place] 12/2016, neg CT AP 06/17/2019.   #2.  Colorectal cancer screening. Neg colon 10/2011. Next due 10/2021   Plan: -Trial of protonix 40mg  po qd #30 1/2hr before breakfast, 6 refills. -HIDA with EF -FU in 6 months.. -If still with problems, GES, followed by food allergy testing followed by hydrogen breath test if needed for bloating.   HPI:    Traci Houston is a 59 y.o. female  [place], history is to interpreter Epigastric pain with regugitation, occasionally radiating to the chest.  No shortness of breath, wheezing or cough.  No nausea, vomiting, regurgitation, odynophagia or dysphagia.  No significant diarrhea or constipation.  No melena or hematochezia. No unintentional weight loss.  Does give history of passing pellet-like stools.  Milk & cheese would make abdominal bloating worse.  She could not identify any other food intolerance.  Does have generalized malaise and occasional weakness.  Has been noticing that food would occasionally get hung up in the mid chest.  She does give history of some heartburn.  Had negative evaluation as above including EGD, colonoscopy, ultrasound.   Failed multiple medications including nortriptyline, IBgard, fiber.   Past Medical History:  Diagnosis Date  . Diastolic CHF ([place]) 0/97/3532   -mild on echo 2017 for murmur   . Heart murmur 09/08/2015   Echo 2017: -Normal LV systolic function; grade 1 diastolic dysfunction; trace AI; mild MR and TR.   Marland Kitchen Thyroid disease     Past Surgical History:  Procedure Laterality Date  . COLONOSCOPY    . ESOPHAGOGASTRODUODENOSCOPY    . EYE SURGERY      Family History  Problem Relation Age of Onset  . Prostate cancer Father   . Heart disease Sister   . Colon cancer Neg Hx   . Breast cancer Neg Hx      Social History   Tobacco Use  . Smoking status: Never Smoker  . Smokeless tobacco: Never Used  Vaping Use  . Vaping Use: Never used  Substance Use Topics  . Alcohol use: No  . Drug use: No    Current Outpatient Medications  Medication Sig Dispense Refill  . Famotidine (PEPCID PO) Take 1 tablet by mouth as needed.    Marland Kitchen levothyroxine (SYNTHROID) 75 MCG tablet TAKE 1 TABLET DAILY BEFORE BREAKFAST 90 tablet 1  . Multiple Vitamin (MULTIVITAMIN) tablet Take 1 tablet by mouth daily.    . Simethicone (GAS-X PO) Take 1 tablet by mouth as needed.     No current facility-administered medications for this visit.    No Known Allergies  Review of Systems:   Psychiatric/Behavioral: Has anxiety or depression     Physical Exam:    BP 90/62   Pulse 82   Ht 5' (1.524 m)   Wt 96 lb 8 oz (43.8 kg)   LMP 08/10/2011   BMI 18.85 kg/m  Wt Readings from Last 3 Encounters:  12/16/19 96 lb 8 oz (43.8 kg)  11/04/19 99 lb (44.9 kg)  08/05/19 99 lb 6.4 oz (45.1 kg)   Constitutional:  Well-developed, in no acute distress. Psychiatric: Normal mood and affect. Behavior is normal. HEENT: Pupils normal.  Conjunctivae are normal. No scleral icterus. Cardiovascular: Normal rate, regular rhythm. No edema Pulmonary/chest: Effort normal and breath sounds normal. No wheezing, rales  or rhonchi. Abdominal: Soft, nondistended.  Epigastric and left lower quadrant abdominal tenderness bowel sounds active throughout. There are no masses palpable. No hepatomegaly. Rectal:  defered Neurological: Alert and oriented to person place and time. Skin: Skin is warm and dry. No rashes noted.  Data Reviewed: I have personally reviewed following labs and imaging studies  CBC: CBC Latest Ref Rng & Units 06/10/2019 11/17/2018 12/03/2017  WBC 4.0 - 10.5 K/uL 4.0 3.8(L) 4.4  Hemoglobin 12.0 - 15.0 g/dL 14.0 13.9 13.3  Hematocrit 36 - 46 % 43.1 41.7 41.8  Platelets 150 - 400 K/uL 339.0 300.0 328    CMP: CMP Latest  Ref Rng & Units 06/10/2019 11/17/2018 12/03/2017  Glucose 70 - 99 mg/dL 86 77 77  BUN 6 - 23 mg/dL 8 11 9   Creatinine 0.40 - 1.20 mg/dL 0.59 0.58 0.61  Sodium 135 - 145 mEq/L 138 139 143  Potassium 3.5 - 5.1 mEq/L 3.5 3.9 4.2  Chloride 96 - 112 mEq/L 101 100 101  CO2 19 - 32 mEq/L 31 32 26  Calcium 8.4 - 10.5 mg/dL 9.4 9.3 8.9  Total Protein 6.0 - 8.3 g/dL 7.7 7.3 7.0  Total Bilirubin 0.2 - 1.2 mg/dL 0.6 1.0 0.6  Alkaline Phos 39 - 117 U/L 54 48 53  AST 0 - 37 U/L 16 17 16   ALT 0 - 35 U/L 7 8 9       Carmell Austria, MD 12/16/2019, 10:46 AM  Cc: Isaac Bliss, Estel*

## 2019-12-28 ENCOUNTER — Other Ambulatory Visit: Payer: Self-pay

## 2019-12-28 ENCOUNTER — Ambulatory Visit (HOSPITAL_COMMUNITY)
Admission: RE | Admit: 2019-12-28 | Discharge: 2019-12-28 | Disposition: A | Discharge status: Home / Self Care | Payer: BC Managed Care – PPO | Source: Ambulatory Visit | Attending: Gastroenterology | Admitting: Gastroenterology

## 2019-12-28 DIAGNOSIS — R112 Nausea with vomiting, unspecified: Secondary | ICD-10-CM | POA: Insufficient documentation

## 2019-12-28 MED ORDER — TECHNETIUM TC 99M MEBROFENIN IV KIT
5.0200 | PACK | Freq: Once | INTRAVENOUS | Status: AC | PRN
  Administered 2019-12-28: 5.02 via INTRAVENOUS

## 2020-01-18 NOTE — Progress Notes (Signed)
59 y.o. G2P2 Married [place] female here for annual exam and follow up on fibroid.  Has 5 cm fundal fibroid, stable on [place].  Pelvic US scheduled for Feb. 2022.   Denies pain or bleeding.   Did Covid vaccine.  She plans a booster.  No flu vaccine yet.   PCP:  Rayford Halsted. Traci Bliss, MD   Patient's last menstrual period was 08/10/2011.           Sexually active: No.  The current method of family planning is post menopausal status.    Exercising: Yes.    walking Smoker:  no  Health Maintenance: Pap: 03-18-16 Neg, 07-09-13 Neg, 07-10-11 Neg History of abnormal Pap:  Yes, per patient years ago MMG: 09-24-19 3D/Neg/density D/Birads1 Colonoscopy: 10-14-11 normal;next 10 years BMD: 01-25-15  Result:Osteopenia w/PCP TDaP: 03-18-16 Gardasil:   n/a GOT:LXBWI Hep C:never Screening Labs:  PCP   reports that she has never smoked. She has never used smokeless tobacco. She reports that she does not drink alcohol and does not use drugs.  Past Medical History:  Diagnosis Date  . Diastolic CHF ([place]) 04/14/5595   -mild on echo 2017 for murmur   . Heart murmur 09/08/2015   Echo 2017: -Normal LV systolic function; grade 1 diastolic dysfunction; trace AI; mild MR and TR.   Marland Kitchen Thyroid disease     Past Surgical History:  Procedure Laterality Date  . COLONOSCOPY    . ESOPHAGOGASTRODUODENOSCOPY    . EYE SURGERY      Current Outpatient Medications  Medication Sig Dispense Refill  . Famotidine (PEPCID PO) Take 1 tablet by mouth as needed.     Marland Kitchen levothyroxine (SYNTHROID) 75 MCG tablet TAKE 1 TABLET DAILY BEFORE BREAKFAST 90 tablet 1  . Multiple Vitamin (MULTIVITAMIN) tablet Take 1 tablet by mouth daily.    . pantoprazole (PROTONIX) 40 MG tablet Take 1 tablet (40 mg total) by mouth daily. 30 tablet 3  . Simethicone (GAS-X PO) Take 1 tablet by mouth as needed. (Patient not taking: Reported on 01/20/2020)     No current facility-administered medications for this visit.    Family History   Problem Relation Age of Onset  . Prostate cancer Father   . Heart disease Sister   . Colon cancer Neg Hx   . Breast cancer Neg Hx     Review of Systems  Constitutional: Negative.   HENT: Negative.   Eyes: Negative.   Respiratory: Negative.   Cardiovascular: Negative.   Gastrointestinal: Negative.   Endocrine: Negative.   Genitourinary: Negative.   Musculoskeletal: Negative.   Skin: Negative.   Allergic/Immunologic: Negative.   Neurological: Negative.   Hematological: Negative.   Psychiatric/Behavioral: Negative.     Exam:   BP 110/62 (BP Location: Left Arm, Patient Position: Sitting, Cuff Size: Normal)   Pulse 88   Resp 16   Ht 5' 0.5" (1.537 m)   Wt 98 lb 12.8 oz (44.8 kg)   LMP 08/10/2011   BMI 18.98 kg/m     General appearance: alert, cooperative and appears stated age Head: normocephalic, without obvious abnormality, atraumatic Neck: no adenopathy, supple, symmetrical, trachea midline and thyroid normal to inspection and palpation Lungs: clear to auscultation bilaterally Breasts: normal appearance, no masses or tenderness, No nipple retraction or dimpling, No nipple discharge or bleeding, No axillary adenopathy Heart: regular rate and rhythm Abdomen: soft, non-tender; no masses, no organomegaly Extremities: extremities normal, atraumatic, no cyanosis or edema Skin: skin color, texture, turgor normal. No rashes or lesions Lymph nodes:  cervical, supraclavicular, and axillary nodes normal. Neurologic: grossly normal  Pelvic: External genitalia:  no lesions              No abnormal inguinal nodes palpated.              Urethra:  normal appearing urethra with no masses, tenderness or lesions              Bartholins and Skenes: normal                 Vagina: normal appearing vagina with normal color and discharge, no lesions              Cervix: no lesions              Pap taken: Yes.   Bimanual Exam:  Uterus:  normal size, contour, position, consistency, mobility,  non-tender              Adnexa: no mass, fullness, tenderness              Rectal exam: Yes.  .  Confirms.              Anus:  normal sphincter tone, no lesions  Chaperone was present for exam.  Assessment:   Well woman visit with normal exam. Uterine fibroid, stable on imaging.  Osteopenia.   Plan: Mammogram screening discussed. Self breast awareness reviewed. Pap and HR HPV as above. Guidelines for Calcium, Vitamin D, regular exercise program including cardiovascular and weight bearing exercise. Pelvic US Feb. 2022. Follow up annually and prn.   After visit summary provided.

## 2020-01-20 ENCOUNTER — Encounter: Payer: Self-pay | Admitting: Obstetrics and Gynecology

## 2020-01-20 ENCOUNTER — Ambulatory Visit (INDEPENDENT_AMBULATORY_CARE_PROVIDER_SITE_OTHER): Payer: BC Managed Care – PPO | Admitting: Obstetrics and Gynecology

## 2020-01-20 ENCOUNTER — Other Ambulatory Visit: Payer: Self-pay

## 2020-01-20 ENCOUNTER — Other Ambulatory Visit (HOSPITAL_COMMUNITY)
Admission: RE | Admit: 2020-01-20 | Discharge: 2020-01-20 | Disposition: A | Discharge status: Home / Self Care | Payer: BC Managed Care – PPO | Source: Ambulatory Visit | Attending: Obstetrics and Gynecology | Admitting: Obstetrics and Gynecology

## 2020-01-20 VITALS — BP 110/62 | HR 88 | Resp 16 | Ht 60.5 in | Wt 98.8 lb

## 2020-01-20 DIAGNOSIS — D219 Benign neoplasm of connective and other soft tissue, unspecified: Secondary | ICD-10-CM | POA: Diagnosis not present

## 2020-01-20 DIAGNOSIS — Z01419 Encounter for gynecological examination (general) (routine) without abnormal findings: Secondary | ICD-10-CM | POA: Diagnosis not present

## 2020-01-20 NOTE — Patient Instructions (Signed)

## 2020-01-22 LAB — CYTOLOGY - PAP
Comment: NEGATIVE
Diagnosis: NEGATIVE
Diagnosis: REACTIVE
High risk HPV: NEGATIVE

## 2020-01-24 ENCOUNTER — Telehealth: Payer: Self-pay

## 2020-01-24 NOTE — Telephone Encounter (Signed)
Call to patient. Per DPR, OK to leave message on voicemail.   Left voicemail via Candy Sledge (ID# [id number]), requesting a return call to [facility] to review benefits and schedule recommended Pelvic ultrasound with Brook A. Quincy Simmonds, MD, Cherlynn June

## 2020-01-27 NOTE — Telephone Encounter (Signed)
Patient is returning call.  °

## 2020-01-28 ENCOUNTER — Other Ambulatory Visit: Payer: Self-pay | Admitting: Gastroenterology

## 2020-01-28 ENCOUNTER — Telehealth: Payer: Self-pay | Admitting: Gastroenterology

## 2020-01-28 DIAGNOSIS — R1013 Epigastric pain: Secondary | ICD-10-CM

## 2020-01-28 NOTE — Telephone Encounter (Signed)
Pt is requesting a call back from a nurse, pt states her symptoms epigastric pain with atypical chest pain, pt has been taking PROTONIX but it is not helping her, pt would like some advice on what to do.

## 2020-01-28 NOTE — Telephone Encounter (Signed)
Spoke to patient who was last seen in office on 12/16/19 for epigastric pain with atypical chest pain. She was started on Protonix 40 mg daily,had a normal HIDA scan. She continues to complain of Epigastric pain with regugitation, occasionally radiating to the chest.  No shortness of breath, wheezing or cough. It was recommended patient has a GES. Procedure discussed with patient. She would like to proceed. Appointment scheduled. Will notify with further recommendations based on test results.

## 2020-01-31 ENCOUNTER — Telehealth: Payer: Self-pay

## 2020-01-31 NOTE — Telephone Encounter (Signed)
Called patient via [facility] interpreter [phone]. Detailed message with normal pap;neg HR HPV was left on patient's voicemail per DPR. Call if any questions.

## 2020-01-31 NOTE — Telephone Encounter (Signed)
Call to patient. Per DPR, OK to leave message on voicemail.  Left voicemail via [place]on [place] (ID# [phone]), requesting a return call to [facility] to review benefits and schedule recommended Pelvic ultrasound with Brook A. Quincy Simmonds, MD, Cherlynn June

## 2020-01-31 NOTE — Telephone Encounter (Signed)
-----   Message from Nunzio Cobbs, MD sent at 01/26/2020  1:09 PM EST ----- Please contact patient with results of her pap which show normal cells and negative HR HPV.  Annual exam recall - 02.  I am highlighting this result so you know to contact the patient.

## 2020-02-01 NOTE — Telephone Encounter (Signed)
Great plan RG

## 2020-02-07 NOTE — Telephone Encounter (Signed)
Patient is returning call.  °

## 2020-02-17 ENCOUNTER — Ambulatory Visit (HOSPITAL_COMMUNITY): Payer: BC Managed Care – PPO

## 2020-02-17 NOTE — Telephone Encounter (Signed)
Call to patient. Per DPR, OK to leave message on voicemail.  Left voicemailvia Interpreter, Eddy (ID# [phone]),[id number] a return call to [facility] to review benefits and schedule recommendedPelvic [employer] A. Quincy Simmonds, MD, Cherlynn June

## 2020-02-21 ENCOUNTER — Other Ambulatory Visit: Payer: Self-pay

## 2020-02-21 ENCOUNTER — Encounter (HOSPITAL_COMMUNITY)
Admission: RE | Admit: 2020-02-21 | Discharge: 2020-02-21 | Disposition: A | Discharge status: Home / Self Care | Payer: BC Managed Care – PPO | Source: Ambulatory Visit | Attending: Gastroenterology | Admitting: Gastroenterology

## 2020-02-21 DIAGNOSIS — R1013 Epigastric pain: Secondary | ICD-10-CM | POA: Diagnosis not present

## 2020-02-21 MED ORDER — TECHNETIUM TC 99M SULFUR COLLOID
2.2000 | Freq: Once | INTRAVENOUS | Status: AC | PRN
  Administered 2020-02-21: 2.2 via INTRAVENOUS

## 2020-02-23 ENCOUNTER — Encounter: Payer: Self-pay | Admitting: Gastroenterology

## 2020-03-07 ENCOUNTER — Telehealth: Payer: Self-pay | Admitting: Gastroenterology

## 2020-03-07 NOTE — Telephone Encounter (Signed)
Spoke to patient's daughter who reports patient still having gas and blotting especially upon waking in the morning despite negative GI work up. It was recommendes she take her Pepcid 20  mg at bedtime and Protonix 30 -60 minutes before her first meal. She will take Gas-x as needed. Patient will try the recommendations and contact the office next week to report her response.

## 2020-05-15 DIAGNOSIS — R079 Chest pain, unspecified: Secondary | ICD-10-CM | POA: Diagnosis not present

## 2020-05-15 DIAGNOSIS — R0602 Shortness of breath: Secondary | ICD-10-CM | POA: Diagnosis not present

## 2020-05-18 ENCOUNTER — Emergency Department (HOSPITAL_COMMUNITY): Payer: BC Managed Care – PPO

## 2020-05-18 ENCOUNTER — Encounter (HOSPITAL_COMMUNITY): Payer: Self-pay | Admitting: *Deleted

## 2020-05-18 ENCOUNTER — Emergency Department (HOSPITAL_COMMUNITY)
Admission: EM | Admit: 2020-05-18 | Discharge: 2020-05-18 | Disposition: A | Discharge status: Home / Self Care | Payer: BC Managed Care – PPO | Attending: Emergency Medicine | Admitting: Emergency Medicine

## 2020-05-18 ENCOUNTER — Other Ambulatory Visit: Payer: Self-pay

## 2020-05-18 DIAGNOSIS — R1013 Epigastric pain: Secondary | ICD-10-CM | POA: Diagnosis not present

## 2020-05-18 DIAGNOSIS — E876 Hypokalemia: Secondary | ICD-10-CM | POA: Diagnosis not present

## 2020-05-18 DIAGNOSIS — R072 Precordial pain: Secondary | ICD-10-CM

## 2020-05-18 DIAGNOSIS — Z79899 Other long term (current) drug therapy: Secondary | ICD-10-CM | POA: Diagnosis not present

## 2020-05-18 DIAGNOSIS — R079 Chest pain, unspecified: Secondary | ICD-10-CM | POA: Diagnosis not present

## 2020-05-18 DIAGNOSIS — R748 Abnormal levels of other serum enzymes: Secondary | ICD-10-CM | POA: Diagnosis not present

## 2020-05-18 DIAGNOSIS — R0789 Other chest pain: Secondary | ICD-10-CM | POA: Diagnosis not present

## 2020-05-18 DIAGNOSIS — K219 Gastro-esophageal reflux disease without esophagitis: Secondary | ICD-10-CM | POA: Diagnosis not present

## 2020-05-18 DIAGNOSIS — I5030 Unspecified diastolic (congestive) heart failure: Secondary | ICD-10-CM | POA: Insufficient documentation

## 2020-05-18 DIAGNOSIS — D259 Leiomyoma of uterus, unspecified: Secondary | ICD-10-CM | POA: Diagnosis not present

## 2020-05-18 DIAGNOSIS — E039 Hypothyroidism, unspecified: Secondary | ICD-10-CM | POA: Insufficient documentation

## 2020-05-18 DIAGNOSIS — R0602 Shortness of breath: Secondary | ICD-10-CM | POA: Diagnosis not present

## 2020-05-18 LAB — I-STAT BETA HCG BLOOD, ED (MC, WL, AP ONLY): I-stat hCG, quantitative: <5 m[IU]/mL (ref <5)

## 2020-05-18 LAB — CBC
HCT: 42.1 % (ref 36.0–46.0)
Hemoglobin: 13.3 g/dL (ref 12.0–15.0)
MCH: 28.7 pg (ref 26.0–34.0)
MCHC: 31.6 g/dL (ref 30.0–36.0)
MCV: 90.9 fL (ref 80.0–100.0)
Platelets: 307 10*3/uL (ref 150–400)
RBC: 4.63 MIL/uL (ref 3.87–5.11)
RDW: 13.2 % (ref 11.5–15.5)
WBC: 4.8 10*3/uL (ref 4.0–10.5)
nRBC: 0 % (ref 0.0–0.2)

## 2020-05-18 LAB — BASIC METABOLIC PANEL
Anion gap: 8 (ref 5–15)
BUN: 8 mg/dL (ref 6–20)
CO2: 28 mmol/L (ref 22–32)
Calcium: 9.2 mg/dL (ref 8.9–10.3)
Chloride: 101 mmol/L (ref 98–111)
Creatinine, Ser: 0.65 mg/dL (ref 0.44–1.00)
GFR, Estimated: >60 mL/min (ref >60)
Glucose, Bld: 63 mg/dL — ABNORMAL LOW (ref 70–99)
Potassium: 3.2 mmol/L — ABNORMAL LOW (ref 3.5–5.1)
Sodium: 137 mmol/L (ref 135–145)

## 2020-05-18 LAB — HEPATIC FUNCTION PANEL
ALT: 12 U/L (ref 0–44)
AST: 21 U/L (ref 15–41)
Albumin: 3.9 g/dL (ref 3.5–5.0)
Alkaline Phosphatase: 40 U/L (ref 38–126)
Bilirubin, Direct: <0.1 mg/dL (ref 0.0–0.2)
Total Bilirubin: 1 mg/dL (ref 0.3–1.2)
Total Protein: 7.3 g/dL (ref 6.5–8.1)

## 2020-05-18 LAB — LIPASE, BLOOD: Lipase: 55 U/L — ABNORMAL HIGH (ref 11–51)

## 2020-05-18 LAB — BRAIN NATRIURETIC PEPTIDE: B Natriuretic Peptide: 9.9 pg/mL (ref 0.0–100.0)

## 2020-05-18 LAB — TROPONIN I (HIGH SENSITIVITY)
Troponin I (High Sensitivity): 2 ng/L (ref <18)
Troponin I (High Sensitivity): 3 ng/L (ref <18)

## 2020-05-18 MED ORDER — LIDOCAINE VISCOUS HCL 2 % MT SOLN
15.0000 mL | Freq: Once | OROMUCOSAL | Status: AC
  Administered 2020-05-18: 15 mL via ORAL
  Filled 2020-05-18: qty 15

## 2020-05-18 MED ORDER — IOHEXOL 350 MG/ML SOLN
75.0000 mL | Freq: Once | INTRAVENOUS | Status: AC | PRN
  Administered 2020-05-18: 75 mL via INTRAVENOUS

## 2020-05-18 MED ORDER — POTASSIUM CHLORIDE CRYS ER 20 MEQ PO TBCR
40.0000 meq | EXTENDED_RELEASE_TABLET | Freq: Once | ORAL | Status: AC
  Administered 2020-05-18: 40 meq via ORAL
  Filled 2020-05-18: qty 2

## 2020-05-18 MED ORDER — ALUM & MAG HYDROXIDE-SIMETH 200-200-20 MG/5ML PO SUSP
30.0000 mL | Freq: Once | ORAL | Status: AC
  Administered 2020-05-18: 30 mL via ORAL
  Filled 2020-05-18: qty 30

## 2020-05-18 MED ORDER — MORPHINE SULFATE (PF) 4 MG/ML IV SOLN
4.0000 mg | Freq: Once | INTRAVENOUS | Status: AC
  Administered 2020-05-18: 4 mg via INTRAVENOUS
  Filled 2020-05-18: qty 1

## 2020-05-18 MED ORDER — ONDANSETRON HCL 4 MG/2ML IJ SOLN
4.0000 mg | Freq: Once | INTRAMUSCULAR | Status: AC
  Administered 2020-05-18: 4 mg via INTRAVENOUS
  Filled 2020-05-18: qty 2

## 2020-05-18 MED ORDER — SODIUM CHLORIDE 0.9 % IV BOLUS
1000.0000 mL | Freq: Once | INTRAVENOUS | Status: AC
  Administered 2020-05-18: 1000 mL via INTRAVENOUS

## 2020-05-18 NOTE — Discharge Instructions (Addendum)
Follow up with primary care provider and cardiology  Return for new or worsening symptoms

## 2020-05-18 NOTE — ED Provider Notes (Signed)
[place] EMERGENCY DEPARTMENT Provider Note   CSN: [id number] Arrival date & time: 05/18/20  7371     History Chief Complaint  Patient presents with  . Chest Pain    Traci Houston is a 60 y.o. female history significant for GERD, CHF, hypothyroidism, atypical chest pain who presents for evaluation of chest pain and epigastric pain x1 week.  States is constant in nature.  Pain is nonexertional nonpleuritic in nature.  Is not resolved with rest or increase in activities due to diaphoresis, nausea or vomiting.  Radiates into her thoracic back.  States she does have history of some chronic back pain however is unsure if she is able to tolerate this is her chronic pain.  Has not taken anything for symptoms.  Her current pain is an 8/10.  Does have known history of CHF however is not followed with cardiology since 2018.  She is not on any diuretics.  She denies any PND orthopnea.  No unilateral leg swelling, redness or warmth.  No recent surgery, immobilization, malignancy, history of clotting disorders, PE or DVT.  Abdominal pain/epigastric pain is not worse with food intake.  There is no radiation to the right upper quadrant.  No associated diarrhea.  She denies fever, chills, headache, lightheadedness, dizziness, hemoptysis, cough, congestion, rhinorrhea, paresthesias or weakness.  Denies any recent injury or trauma.  States she does help her PCP for food.  She is unable to tell me if this is similar.  No history of chronic NSAID use, EtOH use.  States she had similar chest pain in 2018, was seen by cardiology had negative stress test.  Denies any prior heart catheterizations.  Denies additional aggravating or alleviating factors   Vaccinated COVID with booster, no sick contacts  Patient was seen by urgent care.  Sent here for further evaluation.  Urgent care does note report of 70% blockage into coronary arteries however patient denies any prior heart catheterizations, I do not  see any records for this.  He see report at echo however I do not see an cardiology or PCP notes any notes of CAD.  History obtained from patient and past medical records.  Medical [place] interpreter was used.     HPI     Past Medical History:  Diagnosis Date  . Diastolic CHF ([place]) 0/62/6948   -mild on echo 2017 for murmur   . Heart murmur 09/08/2015   Echo 2017: -Normal LV systolic function; grade 1 diastolic dysfunction; trace AI; mild MR and TR.   Marland Kitchen Thyroid disease     Patient Active Problem List   Diagnosis Date Noted  . Vitamin D deficiency 11/17/2018  . Atypical chest pain 12/18/2017  . Abdominal pain, epigastric 12/18/2017  . Upper back pain 12/18/2017  . Chronic nonintractable headache 12/18/2017  . Mitral valve prolapse 03/18/2016  . Hypothyroidism 06/27/2008    Past Surgical History:  Procedure Laterality Date  . COLONOSCOPY    . ESOPHAGOGASTRODUODENOSCOPY    . EYE SURGERY       OB History    Gravida  2   Para  2   Term      Preterm      AB      Living  2     SAB      IAB      Ectopic      Multiple      Live Births              Family  History  Problem Relation Age of Onset  . Prostate cancer Father   . Heart disease Sister   . Colon cancer Neg Hx   . Breast cancer Neg Hx     Social History   Tobacco Use  . Smoking status: Never Smoker  . Smokeless tobacco: Never Used  Vaping Use  . Vaping Use: Never used  Substance Use Topics  . Alcohol use: No  . Drug use: No    Home Medications Prior to Admission medications   Medication Sig Start Date End Date Taking? Authorizing Provider  Multiple Vitamin (MULTIVITAMIN) tablet Take 1 tablet by mouth daily.   Yes [provider]  pantoprazole (PROTONIX) 40 MG tablet Take 1 tablet (40 mg total) by mouth daily. 12/16/19  Yes Jackquline Denmark, MD    Allergies    Patient has no known allergies.  Review of Systems   Review of Systems  Constitutional: Negative.    HENT: Negative.   Respiratory: Positive for shortness of breath. Negative for apnea, cough, choking, chest tightness, wheezing and stridor.   Cardiovascular: Positive for chest pain. Negative for palpitations and leg swelling.  Gastrointestinal: Positive for abdominal pain. Negative for abdominal distention, anal bleeding, blood in stool, constipation, diarrhea, nausea, rectal pain and vomiting.  Genitourinary: Negative.   Musculoskeletal: Negative.   Skin: Negative.   Neurological: Negative.   All other systems reviewed and are negative.   Physical Exam Updated Vital Signs BP 104/64   Pulse 72   Temp 98 F (36.7 C) (Oral)   Resp 14   Ht 5\' 2"  (1.575 m)   Wt 43.5 kg   LMP 08/10/2011   SpO2 99%   BMI 17.56 kg/m   Physical Exam Vitals and nursing note reviewed.  Constitutional:      General: She is not in acute distress.    Appearance: She is well-developed. She is not ill-appearing, toxic-appearing or diaphoretic.  HENT:     Head: Normocephalic and atraumatic.  Eyes:     Pupils: Pupils are equal, round, and reactive to light.  Cardiovascular:     Rate and Rhythm: Normal rate.     Pulses:          Radial pulses are 2+ on the right side and 2+ on the left side.       Dorsalis pedis pulses are 2+ on the right side and 2+ on the left side.       Posterior tibial pulses are 2+ on the right side and 2+ on the left side.     Heart sounds: Normal heart sounds.  Pulmonary:     Effort: Pulmonary effort is normal. No respiratory distress.     Breath sounds: Normal breath sounds.     Comments: Speaks in full sentences without difficulty.  Clear to auscultation bilaterally Chest:     Comments: Equal rise and fall to chest wall. Abdominal:     General: Bowel sounds are normal. There is no distension or abdominal bruit.     Palpations: Abdomen is soft. There is no mass or pulsatile mass.     Tenderness: There is abdominal tenderness in the epigastric area. There is no right CVA  tenderness, left CVA tenderness, guarding or rebound. Negative signs include Murphy's sign.     Hernia: No hernia is present.     Comments: Very minimal tenderness in epigastric region, negative Murphy sign.  No overlying skin changes.  No midline pulsatile abdominal mass  Musculoskeletal:  General: Normal range of motion.     Cervical back: Normal range of motion.     Comments: Moves all 4 extremities at difficulty.  No bony tenderness.  Compartments soft.  Homans' sign negative  Feet:     Right foot:     Skin integrity: Skin integrity normal.     Left foot:     Skin integrity: Skin integrity normal.     Comments: No lower extremity edema Skin:    General: Skin is warm and dry.     Capillary Refill: Capillary refill takes less than 2 seconds.     Comments: No edema, erythema or warmth.  No fluctuance or induration.  Neurological:     General: No focal deficit present.     Mental Status: She is alert.     Cranial Nerves: Cranial nerves are intact.     Sensory: Sensation is intact.     Motor: Motor function is intact.     Gait: Gait is intact.     Comments: Cranial nerves II through XII grossly intact Ambulatory with out difficulty     ED Results / Procedures / Treatments   Labs (all labs ordered are listed, but only abnormal results are displayed) Labs Reviewed  BASIC METABOLIC PANEL - Abnormal; Notable for the following components:      Result Value   Potassium 3.2 (*)    Glucose, Bld 63 (*)    All other components within normal limits  LIPASE, BLOOD - Abnormal; Notable for the following components:   Lipase 55 (*)    All other components within normal limits  CBC  HEPATIC FUNCTION PANEL  BRAIN NATRIURETIC PEPTIDE  I-STAT BETA HCG BLOOD, ED ([facility], [facility], [facility] ONLY)  TROPONIN I (HIGH SENSITIVITY)  TROPONIN I (HIGH SENSITIVITY)    EKG EKG Interpretation  Date/Time:  Thursday May 18 2020 10:18:46 EST Ventricular Rate:  82 PR Interval:  172 QRS Duration: 92 QT  Interval:  376 QTC Calculation: 439 R Axis:   130 Text Interpretation: Normal sinus rhythm Possible Right ventricular hypertrophy Abnormal ECG When compared to prior, similar to prior. no STEMI Confirmed by Antony Blackbird [phone]) on 05/18/2020 1:42:51 PM   Radiology DG Chest 2 View  Result Date: 05/18/2020 CLINICAL DATA:  Short of breath and chest pain EXAM: CHEST - 2 VIEW COMPARISON:  12/18/2017 FINDINGS: The heart size and mediastinal contours are within normal limits. Both lungs are clear. The visualized skeletal structures are unremarkable. IMPRESSION: No active cardiopulmonary disease. Electronically Signed   By: Franchot Gallo M.D.   On: 05/18/2020 11:10   CT Angio Chest/Abd/Pel for Dissection W and/or W/WO  Result Date: 05/18/2020 CLINICAL DATA:  Chest and abdominal pain. Possible aortic dissection. EXAM: CT ANGIOGRAPHY CHEST, ABDOMEN AND PELVIS TECHNIQUE: Non-contrast CT of the chest was initially obtained. Multidetector CT imaging through the chest, abdomen and pelvis was performed using the standard protocol during bolus administration of intravenous contrast. Multiplanar reconstructed images and MIPs were obtained and reviewed to evaluate the vascular anatomy. CONTRAST:  78mL OMNIPAQUE IOHEXOL 350 MG/ML SOLN COMPARISON:  CT scan 06/17/2019 FINDINGS: CTA CHEST FINDINGS Cardiovascular: The heart is normal in size. No pericardial effusion. The aorta is normal in caliber. No dissection. No atherosclerotic calcifications. The branch vessels are patent. No definite coronary artery calcifications. The pulmonary arterial tree is well opacified. No filling defects to suggest pulmonary embolism. Mediastinum/Nodes: No mediastinal or hilar mass or adenopathy. The esophagus is grossly normal. Lungs/Pleura: No acute pulmonary findings. No worrisome pulmonary  lesions. No pulmonary nodules or pleural effusion. Musculoskeletal: No breast masses, supraclavicular or axillary adenopathy. The bony thorax is  intact. Review of the MIP images confirms the above findings. CTA ABDOMEN AND PELVIS FINDINGS VASCULAR Aorta: Normal caliber. No dissection. Minimal distal atherosclerotic calcifications. Celiac: Normal SMA: Normal Renals: Normal IMA: Normal Inflow: Normal Veins: Unremarkable Review of the MIP images confirms the above findings. NON-VASCULAR Hepatobiliary: No hepatic lesions are identified. No intrahepatic biliary dilatation. The gallbladder appears normal. No common bile duct dilatation. Pancreas: No mass, inflammation or ductal dilatation. Spleen: Normal size.  No focal lesions. Adrenals/Urinary Tract: The adrenal glands and kidneys are unremarkable. The bladder is normal. Stomach/Bowel: The stomach, duodenum, small bowel and colon are grossly normal. No acute inflammatory changes, mass lesions or obstructive findings. The terminal ileum and appendix are normal. Lymphatic: No abdominal or pelvic lymphadenopathy. Reproductive: 5 cm uterine fibroid.  No adnexal masses. Other: No pelvic mass or adenopathy. No free pelvic fluid collections. No inguinal mass or adenopathy. No abdominal wall hernia or subcutaneous lesions. Musculoskeletal: No significant bony findings. Review of the MIP images confirms the above findings. IMPRESSION: 1. Normal thoracic and abdominal aorta. No aneurysm or dissection. 2. No CT findings for pulmonary embolism. 3. No acute pulmonary findings. 4. No acute abdominal/pelvic findings, mass lesions or lymphadenopathy. 5. 5 cm uterine fibroid. Electronically Signed   By: Marijo Sanes M.D.   On: 05/18/2020 14:36    Procedures Procedures   Medications Ordered in ED Medications  sodium chloride 0.9 % bolus 1,000 mL (0 mLs Intravenous Stopped 05/18/20 1524)  ondansetron (ZOFRAN) injection 4 mg (4 mg Intravenous Given 05/18/20 1304)  morphine 4 MG/ML injection 4 mg (4 mg Intravenous Given 05/18/20 1304)  alum & mag hydroxide-simeth (MAALOX/MYLANTA) 200-200-20 MG/5ML suspension 30 mL (30 mLs  Oral Given 05/18/20 1310)    And  lidocaine (XYLOCAINE) 2 % viscous mouth solution 15 mL (15 mLs Oral Given 05/18/20 1310)  potassium chloride SA (KLOR-CON) CR tablet 40 mEq (40 mEq Oral Given 05/18/20 1526)  iohexol (OMNIPAQUE) 350 MG/ML injection 75 mL (75 mLs Intravenous Contrast Given 05/18/20 1406)    ED Course  I have reviewed the triage vital signs and the nursing notes.  Pertinent labs & imaging results that were available during my care of the patient were reviewed by me and considered in my medical decision making (see chart for details).  60 year old here for evaluation of chest pain, epigastric pain which began 1 week ago.  She is afebrile, nonseptic, not ill-appearing.  Does have history of CHF per prior echo, has been lost to follow-up with cardiology.  She denies any symptoms of PND orthopnea.  She does not appear grossly fluid overloaded.  Her heart and lungs are clear.  Does endorse chest pain and epigastric pain radiating into her back.  She has a nonfocal neuro exam without deficits.  Does have some mild epigastric pain on exam however negative Murphy sign.  Does have history of GERD.  Unfortunately even with interpreter difficult to obtain whether the symptoms are chronic in nature.  I did review that she had an echo in 2018 as well as a stress test.  Stress test was negative for any ischemic changes.  Echo did show some regurgitation to her valves and some mild heart failure.  Appears comfortable in the room.  Did go to urgent care today which per their note.  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  I reviewed her  PCP as well as her cardiology notes over the last 4 years.  Patient denies any prior heart catheterization, I do not see report of this.  Also do not see report of possible CAD in any of her cardiology notes.  They actually state in 2018 she cannot have any risk factors for CAD.  Given chest pain, epigastric pain rating to the back we will plan  on obtaining further imaging.  Labs and imaging personally reviewed and interpreted:  CBC without leukocytosis Metabolic panel potassium 3.2, glucose 63, no additional ultralight, renal or liver normality Troponin 2--3 Lipase 55 Hepatic function panel Wo significant change Chest x-ray without evidence of cardiomegaly, pulmonary edema, pneumothorax, infiltrate Preg neg EKG Wo ischemic changes CT dissection study Wo abnormality   Patient reassessed. Pain improved with GI cocktail.  No consideration for PE as cause of pain.  Patient with 2 - delta troponins in setting of pain x1 week.  Does have mildly elevated lipase however CT scan did not show evidence of pancreatitis.  Has had elevated discussed treating for GERD, close outpatient follow-up.  She is agreeable for this.  Patient is to be discharged with recommendation to follow up with PCP in regards to today's hospital visit. Chest pain is not likely of cardiac or pulmonary etiology d/t presentation, PERC negative, VSS, no tracheal deviation, no JVD or new murmur, RRR, breath sounds equal bilaterally, EKG without acute abnormalities, negative troponin, and negative CXR. Pt has been advised to return to the ED if CP becomes exertional, associated with diaphoresis or nausea, radiates to left jaw/arm, worsens or becomes concerning in any way.   The patient has been appropriately medically screened and/or stabilized in the ED. I have low suspicion for any other emergent medical condition which would require further screening, evaluation or treatment in the ED or require inpatient management.  Patient is hemodynamically stable and in no acute distress.  Patient able to ambulate in department prior to ED.  Evaluation does not show acute pathology that would require ongoing or additional emergent interventions while in the emergency department or further inpatient treatment.  I have discussed the diagnosis with the patient and answered all questions.   Pain is been managed while in the emergency department and patient has no further complaints prior to discharge.  Patient is comfortable with plan discussed in room and is stable for discharge at this time.  I have discussed strict return precautions for returning to the emergency department.  Patient was encouraged to follow-up with PCP/specialist refer to at discharge.   MDM Rules/Calculators/A&P                           Final Clinical Impression(s) / ED Diagnoses Final diagnoses:  Precordial pain  Epigastric pain  Hypokalemia    Rx / DC Orders ED Discharge Orders    None       Marcheta Horsey A, PA-C 05/18/20 1638    Tegeler, Gwenyth Allegra, MD 05/18/20 1655

## 2020-05-18 NOTE — ED Triage Notes (Signed)
Patient was seen at [facility] for c/o chest pain with radiation into her back was told to come to the ED for further eval. C/o sob. However she states this has been going on for a long time.

## 2020-05-22 ENCOUNTER — Other Ambulatory Visit: Payer: Self-pay | Admitting: Internal Medicine

## 2020-05-22 DIAGNOSIS — E039 Hypothyroidism, unspecified: Secondary | ICD-10-CM

## 2020-05-24 ENCOUNTER — Other Ambulatory Visit: Payer: Self-pay

## 2020-05-24 ENCOUNTER — Encounter: Payer: Self-pay | Admitting: Internal Medicine

## 2020-05-24 ENCOUNTER — Ambulatory Visit: Payer: BC Managed Care – PPO | Admitting: Internal Medicine

## 2020-05-24 ENCOUNTER — Other Ambulatory Visit: Payer: Self-pay | Admitting: Internal Medicine

## 2020-05-24 VITALS — BP 90/62 | HR 92 | Temp 97.9°F | Wt 95.0 lb

## 2020-05-24 DIAGNOSIS — R0789 Other chest pain: Secondary | ICD-10-CM | POA: Diagnosis not present

## 2020-05-24 DIAGNOSIS — E039 Hypothyroidism, unspecified: Secondary | ICD-10-CM

## 2020-05-24 LAB — TSH: TSH: 2.25 u[IU]/mL (ref 0.35–4.50)

## 2020-05-24 MED ORDER — LEVOTHYROXINE SODIUM 75 MCG PO TABS: 75.0000 ug | ORAL_TABLET | Freq: Every day | ORAL | 1 refills | Status: DC

## 2020-05-24 MED ORDER — PANTOPRAZOLE SODIUM 40 MG PO TBEC: 40.0000 mg | DELAYED_RELEASE_TABLET | Freq: Two times a day (BID) | ORAL | 1 refills | Status: DC

## 2020-05-24 NOTE — Progress Notes (Signed)
Established Patient Office Visit     This visit occurred during the SARS-CoV-2 public health emergency.  Safety protocols were in place, including screening questions prior to the visit, additional usage of staff PPE, and extensive cleaning of exam room while observing appropriate contact time as indicated for disinfecting solutions.    CC/Reason for Visit: Fullness in throat  HPI: Traci Houston is a 60 y.o. female who is coming in today for the above mentioned reasons.  She has a history of hypothyroidism on levothyroxine with recent stable levels.  She has been complaining of her over 6 months of a fullness in her throat.  She had a thyroid ultrasound that did not show thyroid nodules or enlargement.  It was thought to be related to GERD so she was started on Protonix and sent to GI.  She has had a multitude of studies including upper endoscopies, HIDA scan, gastric emptying study all normal.  GI agreed to continue a trial of Protonix.  She discontinued after only a few weeks as she perceived it was not making a difference.  She was in the emergency room recently with atypical chest pain that resolved after GI cocktail.  All work-up was negative including troponins, chest x-ray, CT angiogram, EKG.  She is here again today with the same complaints.  Here with a translator today.  Past Medical/Surgical History: Past Medical History:  Diagnosis Date  . Diastolic CHF ([place]) 0/93/2355   -mild on echo 2017 for murmur   . Heart murmur 09/08/2015   Echo 2017: -Normal LV systolic function; grade 1 diastolic dysfunction; trace AI; mild MR and TR.   Marland Kitchen Thyroid disease     Past Surgical History:  Procedure Laterality Date  . COLONOSCOPY    . ESOPHAGOGASTRODUODENOSCOPY    . EYE SURGERY      Social History:  reports that she has never smoked. She has never used smokeless tobacco. She reports that she does not drink alcohol and does not use drugs.  Allergies: No Known Allergies  Family  History:  Family History  Problem Relation Age of Onset  . Prostate cancer Father   . Heart disease Sister   . Colon cancer Neg Hx   . Breast cancer Neg Hx      Current Outpatient Medications:  .  levothyroxine (SYNTHROID) 75 MCG tablet, TAKE 1 TABLET DAILY BEFORE BREAKFAST, Disp: 90 tablet, Rfl: 1 .  Multiple Vitamin (MULTIVITAMIN) tablet, Take 1 tablet by mouth daily., Disp: , Rfl:  .  pantoprazole (PROTONIX) 40 MG tablet, Take 1 tablet (40 mg total) by mouth 2 (two) times daily., Disp: 180 tablet, Rfl: 1  Review of Systems:  Constitutional: Denies fever, chills, diaphoresis, appetite change and fatigue.  HEENT: Denies photophobia, eye pain, redness, hearing loss, ear pain, congestion, sore throat, rhinorrhea, sneezing, mouth sores, trouble swallowing, neck pain, neck stiffness and tinnitus.   Respiratory: Denies SOB, DOE, cough, chest tightness,  and wheezing.   Cardiovascular: Denies chest pain, palpitations and leg swelling.  Gastrointestinal: Denies nausea, vomiting, abdominal pain, diarrhea, constipation, blood in stool and abdominal distention.  Genitourinary: Denies dysuria, urgency, frequency, hematuria, flank pain and difficulty urinating.  Endocrine: Denies: hot or cold intolerance, sweats, changes in hair or nails, polyuria, polydipsia. Musculoskeletal: Denies myalgias, back pain, joint swelling, arthralgias and gait problem.  Skin: Denies pallor, rash and wound.  Neurological: Denies dizziness, seizures, syncope, weakness, light-headedness, numbness and headaches.  Hematological: Denies adenopathy. Easy bruising, personal or family bleeding history  Psychiatric/Behavioral: Denies suicidal ideation, mood changes, confusion, nervousness, sleep disturbance and agitation    Physical Exam: Vitals:   05/24/20 1026  BP: 90/62  Pulse: 92  Temp: 97.9 F (36.6 C)  TempSrc: Oral  SpO2: 99%  Weight: 95 lb (43.1 kg)    Body mass index is 17.38 kg/m.   Constitutional:  NAD, calm, comfortable Eyes: PERRL, lids and conjunctivae normal ENMT: Mucous membranes are moist. Neck: normal, supple, no masses, no thyromegaly Respiratory: clear to auscultation bilaterally, no wheezing, no crackles. Normal respiratory effort. No accessory muscle use.  Cardiovascular: Regular rate and rhythm, no murmurs / rubs / gallops. No extremity edema.  Neurologic: Grossly intact and nonfocal. Psychiatric: Normal judgment and insight. Alert and oriented x 3. Normal mood.    Impression and Plan:  Hypothyroidism, unspecified type  - Plan: TSH -Currently on levothyroxine 75 mcg.  Atypical chest pain/fullness in throat -Symptoms do sound like atypical reflux. -I have convinced her to fully try Protonix at a higher dose of 40 mg twice daily for at least 8 to 12 weeks.  If no improvement then referral back to GI to see if they have any other recommendations. -She has had pretty extensive work-up in the last 2 years that has been unremarkable. -Of note she also had a thyroid ultrasound that did not show thyroid enlargement or nodules.    Patient Instructions  -Nice seeing you today!!  -Lab work today; will notify you once results are available.  -Start protonix 40 mg twice daily. If not better in 2 months, go back to see GI.     [name], MD [facility]

## 2020-05-24 NOTE — Patient Instructions (Signed)
-  Nice seeing you today!!  -Lab work today; will notify you once results are available.  -Start protonix 40 mg twice daily. If not better in 2 months, go back to see GI.

## 2020-05-24 NOTE — Addendum Note (Signed)
Addended by: Elmer Picker on: 05/24/2020 10:53 AM   Modules accepted: Orders

## 2020-08-10 ENCOUNTER — Other Ambulatory Visit: Payer: Self-pay

## 2020-08-10 ENCOUNTER — Ambulatory Visit (INDEPENDENT_AMBULATORY_CARE_PROVIDER_SITE_OTHER): Payer: BC Managed Care – PPO | Admitting: Gastroenterology

## 2020-08-10 ENCOUNTER — Encounter: Payer: Self-pay | Admitting: Gastroenterology

## 2020-08-10 VITALS — BP 98/68 | HR 90 | Ht 62.0 in | Wt 96.4 lb

## 2020-08-10 DIAGNOSIS — R0789 Other chest pain: Secondary | ICD-10-CM | POA: Diagnosis not present

## 2020-08-10 DIAGNOSIS — R1013 Epigastric pain: Secondary | ICD-10-CM

## 2020-08-10 MED ORDER — PANTOPRAZOLE SODIUM 40 MG PO TBEC: 40.0000 mg | DELAYED_RELEASE_TABLET | Freq: Two times a day (BID) | ORAL | 1 refills | Status: DC

## 2020-08-10 NOTE — Progress Notes (Signed)
Chief Complaint: FU  Referring Provider:  Isaac Bliss, Est[place]*      ASSESSMENT AND PLAN;   #1.  Epigastric pain with atypical chest pain and associated anxiety. Neg EGD 03/2018. Neg [place] 12/2016, neg CT AP 06/17/2019. Neg CTA 05/2020, Neg GES 02/2020, neg HIDA with EF 12/2019.    #2.  Colorectal cancer screening. Neg colon 10/2011. Next due 10/2021   Plan: - Continue protonix 40mg  po BID - EGD with Bx ASAP -Thereafter, will give her a trial of GI cocktail, if needed. - If still with problems, would consider retrial of amitriptyline followed by esophageal manometry.    HPI:    Traci Houston is a 60 y.o. female  [place], history is to interpreter  Very frustrated with her symptoms  Had longstanding chest pain/epi pain with radiation to back x 3-4 yrs, getting worse over the last 6 months.  Food, no matter what kind makes it worse.  She denies having any nausea or vomiting.  She does give history of regurgitation.  Her heartburn is completely resolved with Protonix.  She has increased Protonix to 40 mg p.o. twice daily but continues to have problems.  No dysphagia or odynophagia.  Advised to get repeat EGD.  Seen in ED on 05/18/2020 and had negative CTA chest Abdo/pelvis  Had extensive GI eval which has been neg as above.  No shortness of breath, wheezing or cough.  No jaundice dark urine or pale stools.  No significant diarrhea or constipation.  No melena or hematochezia. No unintentional weight loss.  Does give history of passing pellet-like stools.  Milk & cheese would make abdominal bloating worse.  She could not identify any other food intolerance.  Does have generalized malaise and occasional weakness.  Has been noticing that food would occasionally get hung up in the mid chest.  She does give history of some heartburn.  Had negative evaluation as above including EGD, colonoscopy, ultrasound, HIDA with ejection fraction, gastric emptying scan.  Failed multiple  medications including nortriptyline, IBgard, fiber.  Wt Readings from Last 3 Encounters:  08/10/20 96 lb 6 oz (43.7 kg)  05/24/20 95 lb (43.1 kg)  05/18/20 96 lb (43.5 kg)    Past Medical History:  Diagnosis Date  . Diastolic CHF ([place]) 2/94/7654   -mild on echo 2017 for murmur   . Heart murmur 09/08/2015   Echo 2017: -Normal LV systolic function; grade 1 diastolic dysfunction; trace AI; mild MR and TR.   Marland Kitchen Thyroid disease     Past Surgical History:  Procedure Laterality Date  . COLONOSCOPY    . ESOPHAGOGASTRODUODENOSCOPY    . EYE SURGERY      Family History  Problem Relation Age of Onset  . Prostate cancer Father   . Heart disease Sister   . Colon cancer Neg Hx   . Breast cancer Neg Hx   . Pancreatic cancer Neg Hx   . Stomach cancer Neg Hx     Social History   Tobacco Use  . Smoking status: Never Smoker  . Smokeless tobacco: Never Used  Vaping Use  . Vaping Use: Never used  Substance Use Topics  . Alcohol use: No  . Drug use: No    Current Outpatient Medications  Medication Sig Dispense Refill  . levothyroxine (SYNTHROID) 75 MCG tablet Take 1 tablet (75 mcg total) by mouth daily before breakfast. 90 tablet 1  . Multiple Vitamin (MULTIVITAMIN) tablet Take 1 tablet by mouth daily.    . pantoprazole (  PROTONIX) 40 MG tablet Take 1 tablet (40 mg total) by mouth 2 (two) times daily. 180 tablet 1   No current facility-administered medications for this visit.    No Known Allergies  Review of Systems:   Psychiatric/Behavioral: Has anxiety or depression     Physical Exam:    BP 98/68   Pulse 90   Ht 5\' 2"  (1.575 m)   Wt 96 lb 6 oz (43.7 kg)   LMP 08/10/2011   SpO2 99%   BMI 17.63 kg/m  Wt Readings from Last 3 Encounters:  08/10/20 96 lb 6 oz (43.7 kg)  05/24/20 95 lb (43.1 kg)  05/18/20 96 lb (43.5 kg)   Constitutional: Thin built, in no acute distress. Psychiatric: Normal mood and affect. Behavior is normal. HEENT: Pupils normal.  Conjunctivae  are normal. No scleral icterus. Cardiovascular: Normal rate, regular rhythm. No edema Pulmonary/chest: Effort normal and breath sounds normal. No wheezing, rales or rhonchi. Abdominal: Soft, nondistended.  Epigastric and left lower quadrant abdominal tenderness bowel sounds active throughout. There are no masses palpable. No hepatomegaly. Rectal:  defered Neurological: Alert and oriented to person place and time. Skin: Skin is warm and dry. No rashes noted.  Data Reviewed: I have personally reviewed following labs and imaging studies  CBC: CBC Latest Ref Rng & Units 05/18/2020 06/10/2019 11/17/2018  WBC 4.0 - 10.5 K/uL 4.8 4.0 3.8(L)  Hemoglobin 12.0 - 15.0 g/dL 13.3 14.0 13.9  Hematocrit 36.0 - 46.0 % 42.1 43.1 41.7  Platelets 150 - 400 K/uL 307 339.0 300.0    CMP: CMP Latest Ref Rng & Units 05/18/2020 06/10/2019 11/17/2018  Glucose 70 - 99 mg/dL 63(L) 86 77  BUN 6 - 20 mg/dL 8 8 11   Creatinine 0.44 - 1.00 mg/dL 0.65 0.59 0.58  Sodium 135 - 145 mmol/L 137 138 139  Potassium 3.5 - 5.1 mmol/L 3.2(L) 3.5 3.9  Chloride 98 - 111 mmol/L 101 101 100  CO2 22 - 32 mmol/L 28 31 32  Calcium 8.9 - 10.3 mg/dL 9.2 9.4 9.3  Total Protein 6.5 - 8.1 g/dL 7.3 7.7 7.3  Total Bilirubin 0.3 - 1.2 mg/dL 1.0 0.6 1.0  Alkaline Phos 38 - 126 U/L 40 54 48  AST 15 - 41 U/L 21 16 17   ALT 0 - 44 U/L 12 7 8       Carmell Austria, MD 08/10/2020, 10:34 AM  Cc: Isaac Bliss, Estel*

## 2020-08-10 NOTE — Patient Instructions (Addendum)
If you are age 60 or older, your body mass index should be between 23-30. Your Body mass index is 17.63 kg/m. If this is out of the aforementioned range listed, please consider follow up with your Primary Care Provider.  If you are age 60 or younger, your body mass index should be between 19-25. Your Body mass index is 17.63 kg/m. If this is out of the aformentioned range listed, please consider follow up with your Primary Care Provider.   __________________________________________________________  The [facility] providers would like to encourage you to use [facility] to communicate with providers for non-urgent requests or questions.  Due to long hold times on the telephone, sending your provider a message by [facility] may be a faster and more efficient way to get a response.  Please allow 48 business hours for a response.  Please remember that this is for non-urgent requests.   You have been scheduled for an endoscopy. Please follow written instructions given to you at your visit today. If you use inhalers (even only as needed), please bring them with you on the day of your procedure.  We have sent the following medications to your pharmacy for you to pick up at your convenience: Protonix  You have requested a cardiologist appointment. Please call [phone] to schedule an appointment in [facility] or call [phone] to schedule an appointment in [place].

## 2020-08-11 ENCOUNTER — Ambulatory Visit (AMBULATORY_SURGERY_CENTER): Payer: BC Managed Care – PPO | Admitting: Gastroenterology

## 2020-08-11 ENCOUNTER — Encounter: Payer: Self-pay | Admitting: Gastroenterology

## 2020-08-11 ENCOUNTER — Other Ambulatory Visit: Payer: Self-pay

## 2020-08-11 VITALS — BP 112/66 | HR 77 | Temp 98.2°F | Resp 19 | Ht 62.0 in | Wt 96.0 lb

## 2020-08-11 DIAGNOSIS — R0789 Other chest pain: Secondary | ICD-10-CM | POA: Diagnosis not present

## 2020-08-11 DIAGNOSIS — K2951 Unspecified chronic gastritis with bleeding: Secondary | ICD-10-CM | POA: Diagnosis not present

## 2020-08-11 DIAGNOSIS — R1013 Epigastric pain: Secondary | ICD-10-CM

## 2020-08-11 DIAGNOSIS — K2960 Other gastritis without bleeding: Secondary | ICD-10-CM

## 2020-08-11 MED ORDER — SODIUM CHLORIDE 0.9 % IV SOLN: 500.0000 mL | Freq: Once | INTRAVENOUS | Status: AC

## 2020-08-11 NOTE — Progress Notes (Signed)
Interpreter [place] present during RR stay.

## 2020-08-11 NOTE — Patient Instructions (Signed)
Impression/Recommendations:  Gastritis handouts given to patient.  Resume previous diet. Continue present medications. Await pathology results.  YOU HAD AN ENDOSCOPIC PROCEDURE TODAY AT [facility]:   Refer to the procedure report that was given to you for any specific questions about what was found during the examination.  If the procedure report does not answer your questions, please call your gastroenterologist to clarify.  If you requested that your care partner not be given the details of your procedure findings, then the procedure report has been included in a sealed envelope for you to review at your convenience later.  YOU SHOULD EXPECT: Some feelings of bloating in the abdomen. Passage of more gas than usual.  Walking can help get rid of the air that was put into your GI tract during the procedure and reduce the bloating. If you had a lower endoscopy (such as a colonoscopy or flexible sigmoidoscopy) you may notice spotting of blood in your stool or on the toilet paper. If you underwent a bowel prep for your procedure, you may not have a normal bowel movement for a few days.  Please Note:  You might notice some irritation and congestion in your nose or some drainage.  This is from the oxygen used during your procedure.  There is no need for concern and it should clear up in a day or so.  SYMPTOMS TO REPORT IMMEDIATELY:  Following upper endoscopy (EGD)  Vomiting of blood or coffee ground material  New chest pain or pain under the shoulder blades  Painful or persistently difficult swallowing  New shortness of breath  Fever of 100F or higher  Black, tarry-looking stools  For urgent or emergent issues, a gastroenterologist can be reached at any hour by calling [phone]. Do not use MyChart messaging for urgent concerns.    DIET:  We do recommend a small meal at first, but then you may proceed to your regular diet.  Drink plenty of fluids but you should avoid  alcoholic beverages for 24 hours.  ACTIVITY:  You should plan to take it easy for the rest of today and you should NOT DRIVE or use heavy machinery until tomorrow (because of the sedation medicines used during the test).    FOLLOW UP: Our staff will call the number listed on your records 48-72 hours following your procedure to check on you and address any questions or concerns that you may have regarding the information given to you following your procedure. If we do not reach you, we will leave a message.  We will attempt to reach you two times.  During this call, we will ask if you have developed any symptoms of COVID 19. If you develop any symptoms (ie: fever, flu-like symptoms, shortness of breath, cough etc.) before then, please call [phone].  If you test positive for Covid 19 in the 2 weeks post procedure, please call and report this information to [place].    If any biopsies were taken you will be contacted by phone or by letter within the next 1-3 weeks.  Please call us at [phone] if you have not heard about the biopsies in 3 weeks.    SIGNATURES/CONFIDENTIALITY: You and/or your care partner have signed paperwork which will be entered into your electronic medical record.  These signatures attest to the fact that that the information above on your After Visit Summary has been reviewed and is understood.  Full responsibility of the confidentiality of this discharge information lies with you  and/or your care-partner. 

## 2020-08-11 NOTE — Progress Notes (Signed)
VS- [employer] used today at the [employer] for this pt.  Interpreter's name is- [place]  Spoke with [employer] CRNA.  Pt ate rice soup, stopping at 8:30 am.  Ok to do procedure 6 hours from that time- 2:30 pm Pt and care partner away- ok to proceed at 2:30 pm

## 2020-08-11 NOTE — Progress Notes (Signed)
Called to room to assist during endoscopic procedure.  Patient ID and intended procedure confirmed with present staff. Received instructions for my participation in the procedure from the performing physician.  

## 2020-08-11 NOTE — Progress Notes (Signed)
PT taken to PACU. Monitors in place. VSS. Report given to RN. 

## 2020-08-11 NOTE — Op Note (Signed)
[facility] Patient Name: Traci Houston Procedure Date: 08/11/2020 12:30 PM MRN: [id number] Endoscopist: Jackquline Denmark , MD Age: 60 Referring MD:  Date of Birth: [birth year] Gender: Female Account #: [id number] Procedure:                Upper GI endoscopy Indications:              Atypical chest pains. Epigastric pain. Medicines:                Monitored Anesthesia Care Procedure:                Pre-Anesthesia Assessment:                           - Prior to the procedure, a History and Physical                            was performed, and patient medications and                            allergies were reviewed. The patient's tolerance of                            previous anesthesia was also reviewed. The risks                            and benefits of the procedure and the sedation                            options and risks were discussed with the patient.                            All questions were answered, and informed consent                            was obtained. Prior Anticoagulants: The patient has                            taken no previous anticoagulant or antiplatelet                            agents. ASA Grade Assessment: II - A patient with                            mild systemic disease. After reviewing the risks                            and benefits, the patient was deemed in                            satisfactory condition to undergo the procedure.                           After obtaining informed consent, the endoscope was  passed under direct vision. Throughout the                            procedure, the patient's blood pressure, pulse, and                            oxygen saturations were monitored continuously. The                            Endoscope was introduced through the mouth, and                            advanced to the second part of duodenum. The upper                            GI endoscopy was  accomplished without difficulty.                            The patient tolerated the procedure well. Scope In: Scope Out: Findings:                 The examined esophagus was normal. Biopsies were                            obtained from the proximal and distal esophagus                            with cold forceps for histology of suspected                            eosinophilic esophagitis.                           Diffuse minimal inflammation characterized by                            erythema was found in the gastric antrum. Biopsies                            were taken with a cold forceps for histology.                           The examined duodenum was normal. Biopsies for                            histology were taken with a cold forceps for                            evaluation of celiac disease. Complications:            No immediate complications. Estimated Blood Loss:     Estimated blood loss: none. Impression:               - Minimal gastritis. Recommendation:           - Patient has a contact number available for  emergencies. The signs and symptoms of potential                            delayed complications were discussed with the                            patient. Return to normal activities tomorrow.                            Written discharge instructions were provided to the                            patient.                           - Resume previous diet.                           - Continue present medications.                           - Await pathology results.                           - The findings and recommendations were discussed                            with the patient's family.                           - If still with problems, consider cardiology                            consultation. If still with problems, consider MRI                            of thoracic spine. Jackquline Denmark, MD 08/11/2020 2:44:01 PM This  report has been signed electronically.

## 2020-08-15 ENCOUNTER — Telehealth: Payer: Self-pay | Admitting: *Deleted

## 2020-08-15 NOTE — Telephone Encounter (Signed)
  Follow up Call-  Call back number 08/11/2020 03/31/2018  Post procedure Call Back phone  # [phone]  Permission to leave phone message - Yes  Some recent data might be hidden     Patient questions:  Do you have a fever, pain , or abdominal swelling? No. Pain Score  0 *  Have you tolerated food without any problems? Yes.    Have you been able to return to your normal activities? Yes.    Do you have any questions about your discharge instructions: Diet   No. Medications  No. Follow up visit  No.  Do you have questions or concerns about your Care? Yes.    Actions: * If pain score is 4 or above: No action needed, pain <4.  1. Have you developed a fever since your procedure? no  2.   Have you had an respiratory symptoms (SOB or cough) since your procedure? no  3.   Have you tested positive for COVID 19 since your procedure no  4.   Have you had any family members/close contacts diagnosed with the COVID 19 since your procedure? no   If yes to any of these questions please route to Joylene John, RN and Joella Prince, RN

## 2020-08-22 ENCOUNTER — Telehealth: Payer: Self-pay | Admitting: Gastroenterology

## 2020-08-22 DIAGNOSIS — R079 Chest pain, unspecified: Secondary | ICD-10-CM

## 2020-08-22 NOTE — Telephone Encounter (Signed)
Inbound call from pt requesting a call back stating she didn't received her medication for her stomach. She did specify which medication. Please advise

## 2020-08-23 NOTE — Telephone Encounter (Signed)
Bx- were neg for celiac disease, HP and EoE Good news  Plan: -Amitriptyline 25 mg p.o. nightly #30, 4 refills -Also needs cardiology consultation (RE: chest pain)  RG

## 2020-08-23 NOTE — Telephone Encounter (Signed)
Patient stated that she was called on the 7th stating that she was told she needed an antibiotic and it would be sent in but I told her that I don't see anything regarding that. I know she was told to continue her Protonix and maybe a GI cocktail if needed. Patient stated that she is having the same symptoms. Please advise.

## 2020-08-24 ENCOUNTER — Encounter: Payer: Self-pay | Admitting: Gastroenterology

## 2020-08-24 MED ORDER — AMITRIPTYLINE HCL 25 MG PO TABS: 25.0000 mg | ORAL_TABLET | Freq: Every day | ORAL | 4 refills | Status: DC

## 2020-08-24 NOTE — Telephone Encounter (Signed)
Referral and medication has been sent. Patient has number for [address] and was told to call in a week if she hasn't heard anything

## 2020-10-17 ENCOUNTER — Ambulatory Visit: Payer: BC Managed Care – PPO | Admitting: Cardiovascular Disease

## 2020-10-17 ENCOUNTER — Other Ambulatory Visit: Payer: Self-pay

## 2020-10-17 ENCOUNTER — Encounter: Payer: Self-pay | Admitting: Cardiovascular Disease

## 2020-10-17 VITALS — BP 100/68 | HR 85 | Ht 62.0 in | Wt 96.0 lb

## 2020-10-17 DIAGNOSIS — R0789 Other chest pain: Secondary | ICD-10-CM | POA: Diagnosis not present

## 2020-10-17 NOTE — Assessment & Plan Note (Signed)
Traci Houston was referred by Dr. Lyndel Safe for evaluation of atypical chest pain.  She basically has no cardiac risk factors.  She did have a negative GXT 07/17/2016 and is CTA of her chest performed because of chest pain to rule out pulmonary embolism 05/18/2020 that showed no coronary calcification.  Her pain occurs 2-3 times a week occasionally lasting all day.  It radiates from between her shoulder blades to her chest.  Does not sound ischemic.  I am going to get a coronary calcium score to further evaluate.

## 2020-10-17 NOTE — Patient Instructions (Signed)
  Testing/Procedures:  CORONARY CT CALCIUM SCORING AT [place]   Follow-Up: At [facility], you and your health needs are our priority.  As part of our continuing mission to provide you with exceptional heart care, we have created designated Provider Care Teams.  These Care Teams include your primary Cardiologist (physician) and Advanced Practice Providers (APPs -  Physician Assistants and Nurse Practitioners) who all work together to provide you with the care you need, when you need it.  We recommend signing up for the patient portal called "MyChart".  Sign up information is provided on this After Visit Summary.  MyChart is used to connect with patients for Virtual Visits (Telemedicine).  Patients are able to view lab/test results, encounter notes, upcoming appointments, etc.  Non-urgent messages can be sent to your provider as well.   To learn more about what you can do with MyChart, go to [email or web address].    Your next appointment:    AS NEEDED

## 2020-10-17 NOTE — Progress Notes (Signed)
10/17/2020 Traci Houston   1960-07-17  [id number]  Primary Physician Traci Houston, Traci Halsted, MD Primary Cardiologist: Lorretta Harp MD Traci Houston, [place]  HPI:  Traci Houston is a 60 y.o. thin appearing single [place] female mother of 2 daughters with no grandchildren who works at [employer]  She was referred by her PCP, got Dr. Lyndel Houston, for evaluation of atypical chest pain.  She basically has no cardiac risk factors.  She is fairly active.  There is no family history for heart disease.  She is never had a heart tach or stroke.  She was evaluated for chest pain and palpitations back in 2017/18 by Dr. Curt Houston.  Routine GXT was normal as was a 2D echocardiogram.  An event monitor showed no evidence of arrhythmia.   Current Meds  Medication Sig   levothyroxine (SYNTHROID) 75 MCG tablet Take 1 tablet (75 mcg total) by mouth daily before breakfast.   Multiple Vitamin (MULTIVITAMIN) tablet Take 1 tablet by mouth daily.   pantoprazole (PROTONIX) 40 MG tablet Take 1 tablet (40 mg total) by mouth 2 (two) times daily.   [DISCONTINUED] amitriptyline (ELAVIL) 25 MG tablet Take 1 tablet (25 mg total) by mouth at bedtime.   Current Facility-Administered Medications for the 10/17/20 encounter (Office Visit) with Lorretta Harp, MD  Medication   0.9 %  sodium chloride infusion     No Known Allergies  Social History   Socioeconomic History   Marital status: Married    Spouse name: Not on file   Number of children: 2   Years of education: Not on file   Highest education level: Not on file  Occupational History    Employer: [employer]  Tobacco Use   Smoking status: Never   Smokeless tobacco: Never  Substance and Sexual Activity   Alcohol use: No   Drug use: No   Sexual activity: Not Currently    Birth control/protection: Post-menopausal  Other Topics Concern   Not on file  Social History Narrative   Patient is right-handed. She lives in a one level home,  her daughter lives with her. She does not drink caffeine. She does not exercise, but states is active at work.   Social Determinants of Health   Financial Resource Strain: Not on file  Food Insecurity: Not on file  Transportation Needs: Not on file  Physical Activity: Not on file  Stress: Not on file  Social Connections: Not on file  Intimate Partner Violence: Not on file     Review of Systems: General: negative for chills, fever, night sweats or weight changes.  Cardiovascular: negative for chest pain, dyspnea on exertion, edema, orthopnea, palpitations, paroxysmal nocturnal dyspnea or shortness of breath Dermatological: negative for rash Respiratory: negative for cough or wheezing Urologic: negative for hematuria Abdominal: negative for nausea, vomiting, diarrhea, bright red blood per rectum, melena, or hematemesis Neurologic: negative for visual changes, syncope, or dizziness All other systems reviewed and are otherwise negative except as noted above.    Blood pressure 100/68, pulse 85, height '5\' 2"'$  (1.575 m), weight 96 lb (43.5 kg), last menstrual period 08/10/2011.  General appearance: alert and no distress Neck: no adenopathy, no carotid bruit, no JVD, supple, symmetrical, trachea midline, and thyroid not enlarged, symmetric, no tenderness/mass/nodules Lungs: clear to auscultation bilaterally Heart: regular rate and rhythm, S1, S2 normal, no murmur, click, rub or gallop Extremities: extremities normal, atraumatic, no cyanosis or edema Pulses: 2+ and symmetric Skin: Skin color,  texture, turgor normal. No rashes or lesions Neurologic: Grossly normal  EKG sinus rhythm at 85 with right axis deviation and incomplete right bundle branch block with low limb voltage.  I personally reviewed this EKG.  ASSESSMENT AND PLAN:   Atypical chest pain Ms. Pischke was referred by Dr. Lyndel Houston for evaluation of atypical chest pain.  She basically has no cardiac risk factors.  She did have a  negative GXT 07/17/2016 and is CTA of her chest performed because of chest pain to rule out pulmonary embolism 05/18/2020 that showed no coronary calcification.  Her pain occurs 2-3 times a week occasionally lasting all day.  It radiates from between her shoulder blades to her chest.  Does not sound ischemic.  I am going to get a coronary calcium score to further evaluate.     Lorretta Harp MD FACP,FACC,FAHA, [facility] 10/17/2020 10:08 AM

## 2020-10-21 IMAGING — US US THYROID
1 series · 13 of 25 positions shown · non-contrast
Comparison: None.

CLINICAL DATA: Other. Compressive thyroid symptoms. Neck fullness.

EXAM:
THYROID ULTRASOUND
TECHNIQUE: Ultrasound examination of the thyroid gland and adjacent soft
tissues was performed.

[Series 1: us thyroid · 0.04mm/px · 13 of 30 slices shown]
[im 1/30]
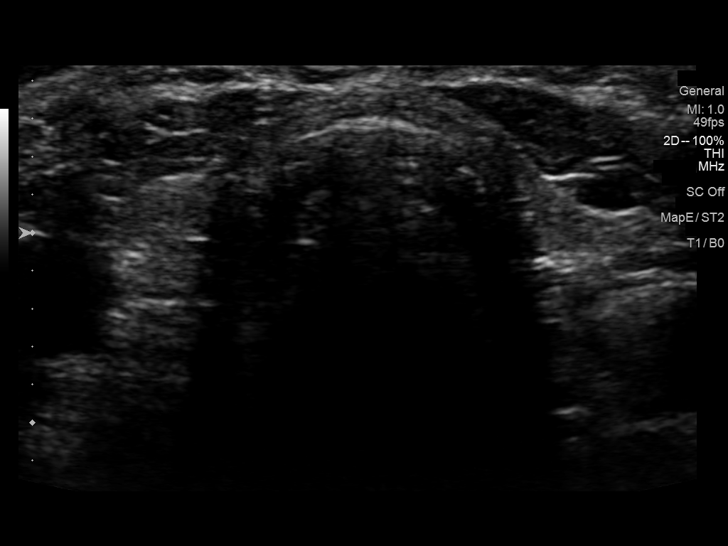
[im 3/30]
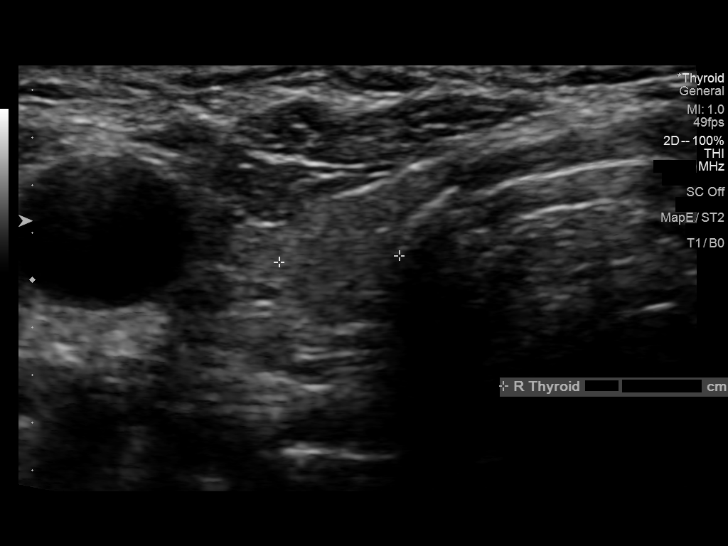
[im 5/30]
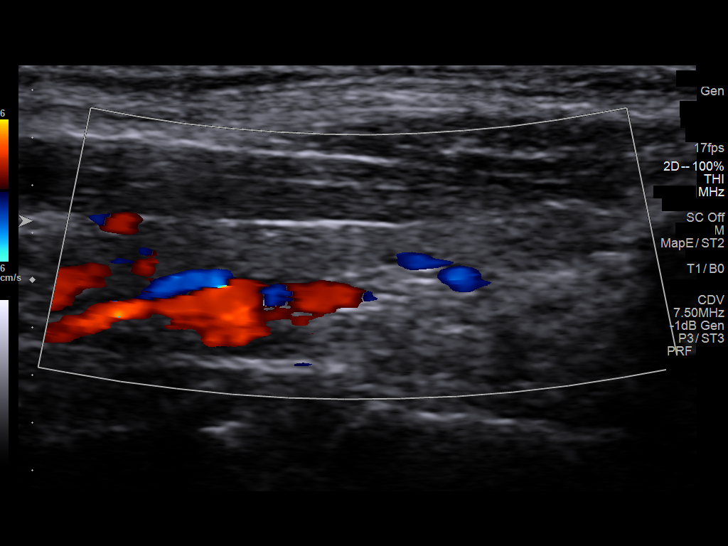
[im 8/30]
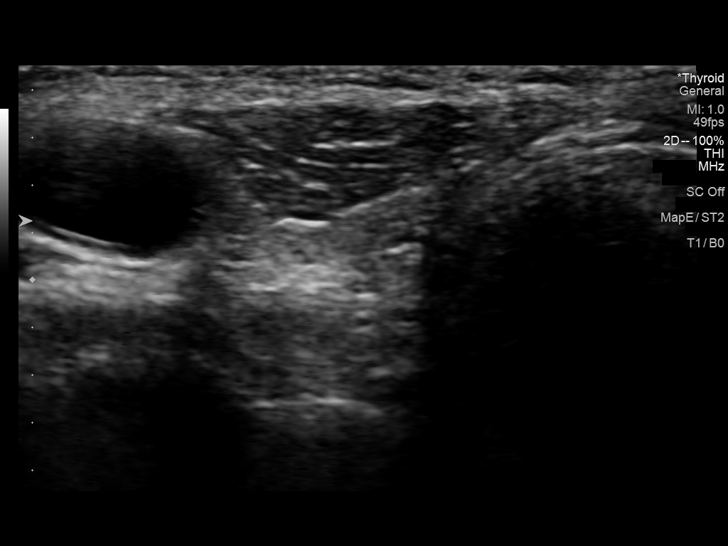
[im 10/30]
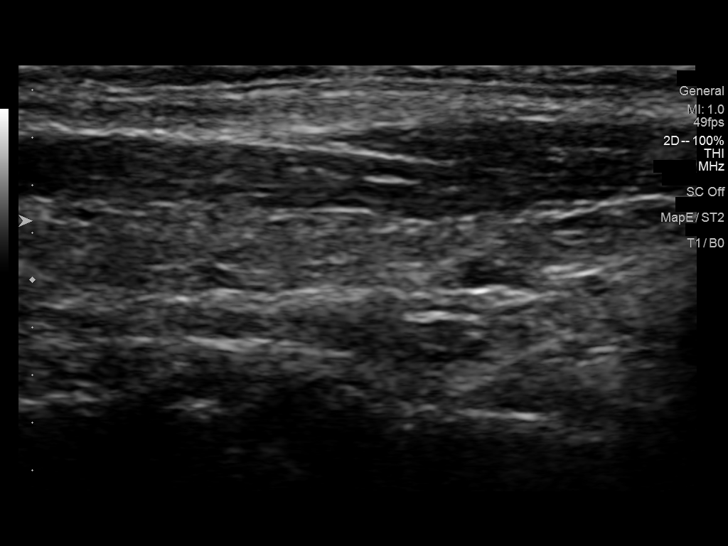
[im 13/30]
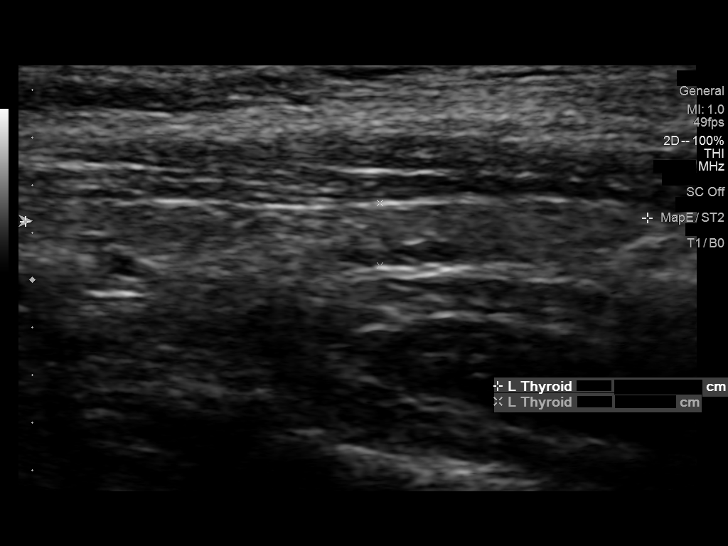
[im 15/30]
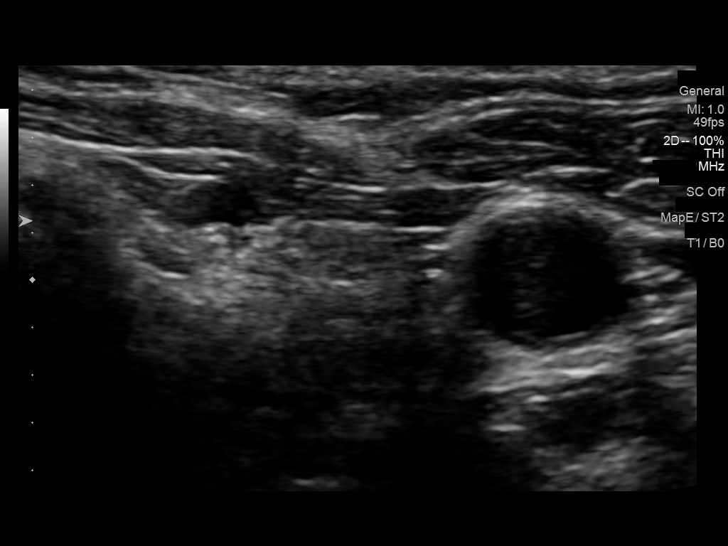
[im 17/30]
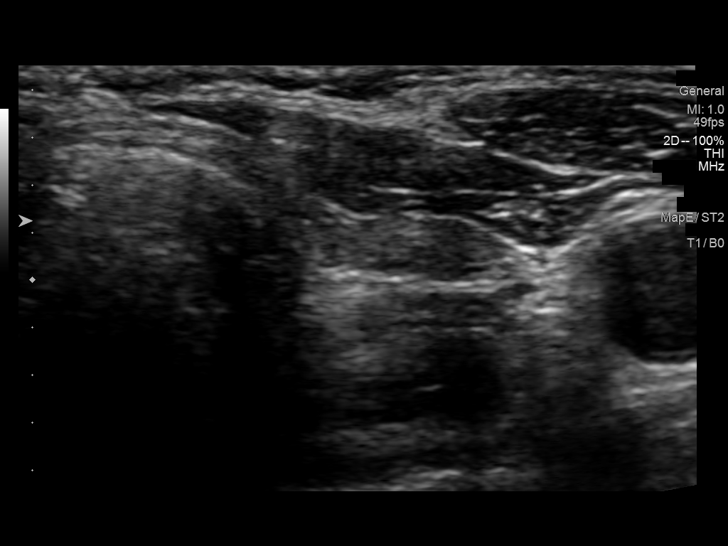
[im 20/30]
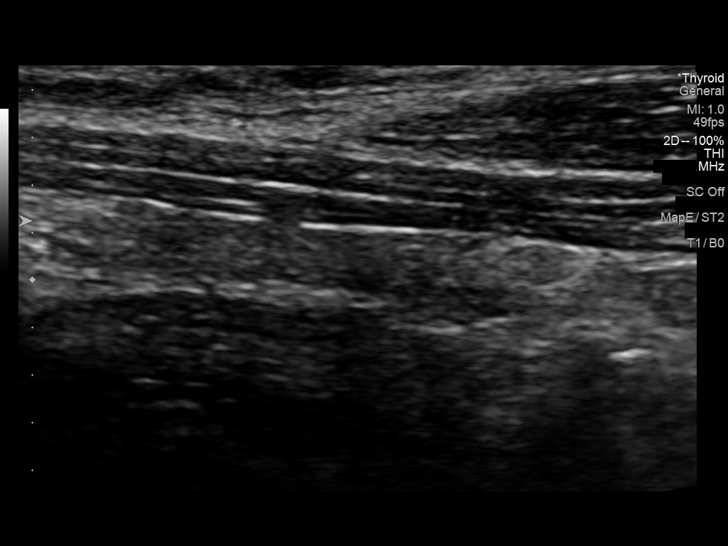
[im 22/30]
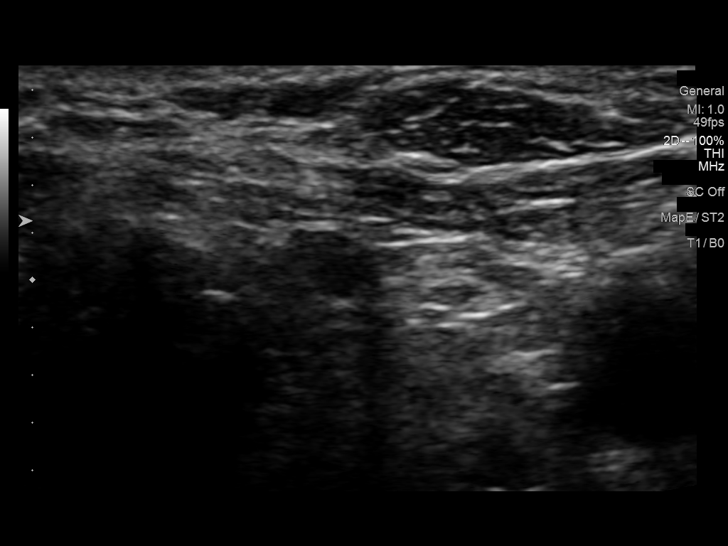
[im 25/30]
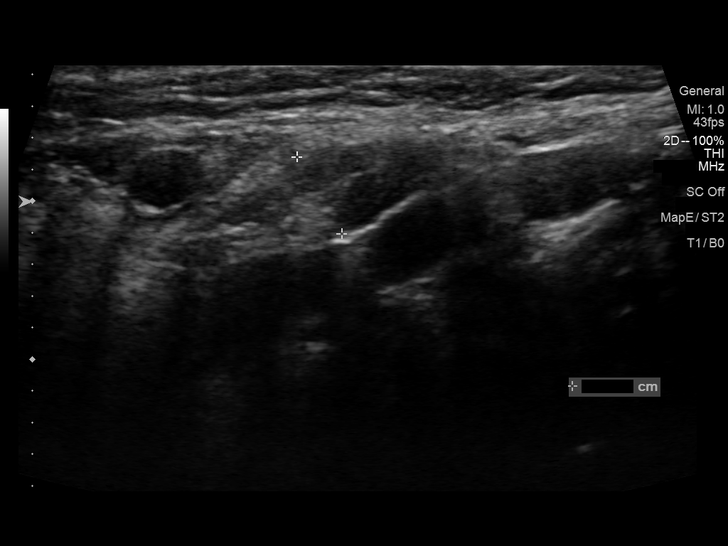
[im 27/30]
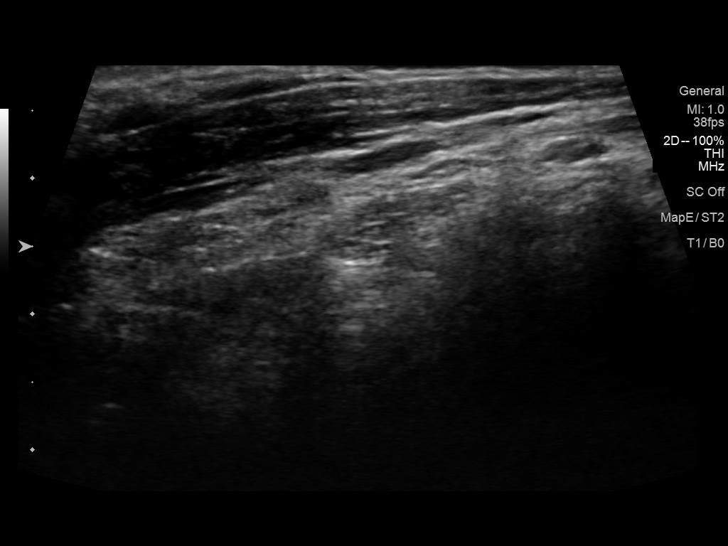
[im 30/30]
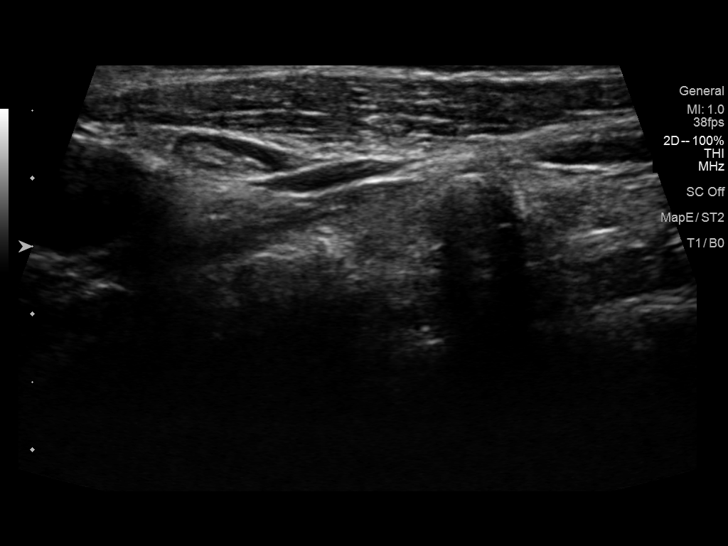

[13 of 25 positions shown; findings below may reference images not displayed]

FINDINGS: Parenchymal Echotexture: Mildly heterogenous

Isthmus: Normal in size measuring 2 mm in diameter

Right lobe: Atrophic measuring 2.4 x 0.4 x 0.5 cm

Left lobe: Atrophic measuring 2.6 x 0.3 x 0.9 cm

_________________________________________________________

Estimated total number of nodules >/= 1 cm: 0

Number of spongiform nodules >/=  2 cm not described below (TR1): 0

Number of mixed cystic and solid nodules >/= 1.5 cm not described
below (TR2): 0

_________________________________________________________

No discrete nodules are seen within the thyroid gland.

Note is made of a benign appearing non pathologically enlarged right
cervical lymph node which is not enlarged by size criteria measuring
0.6 cm in greatest short axis diameter and maintains a benign fatty
hilum. No regional cervical lymphadenopathy.
IMPRESSION: Atrophic and mildly heterogeneous appearing thyroid without discrete
nodule or mass. Findings are nonspecific though could be seen in the
setting of a chronic thyroiditis. Clinical correlation is advised.

## 2020-10-31 ENCOUNTER — Other Ambulatory Visit: Payer: Self-pay | Admitting: Internal Medicine

## 2020-10-31 DIAGNOSIS — R0789 Other chest pain: Secondary | ICD-10-CM

## 2020-11-07 ENCOUNTER — Ambulatory Visit (INDEPENDENT_AMBULATORY_CARE_PROVIDER_SITE_OTHER)
Admission: RE | Admit: 2020-11-07 | Discharge: 2020-11-07 | Disposition: A | Discharge status: Home / Self Care | Payer: Self-pay | Source: Ambulatory Visit | Attending: Cardiovascular Disease | Admitting: Cardiovascular Disease

## 2020-11-07 ENCOUNTER — Other Ambulatory Visit: Payer: Self-pay

## 2020-11-07 DIAGNOSIS — R0789 Other chest pain: Secondary | ICD-10-CM

## 2020-11-09 NOTE — Progress Notes (Signed)
Agree with plan RG 

## 2020-11-15 IMAGING — MG DIGITAL SCREENING BILAT W/ TOMO W/ CAD
8 series · 9 of 24 positions shown · non-contrast
Comparison: Previous exam(s).

CLINICAL DATA: Screening.

EXAM:
DIGITAL SCREENING BILATERAL MAMMOGRAM WITH TOMO AND CAD

[R CC synth-2D]
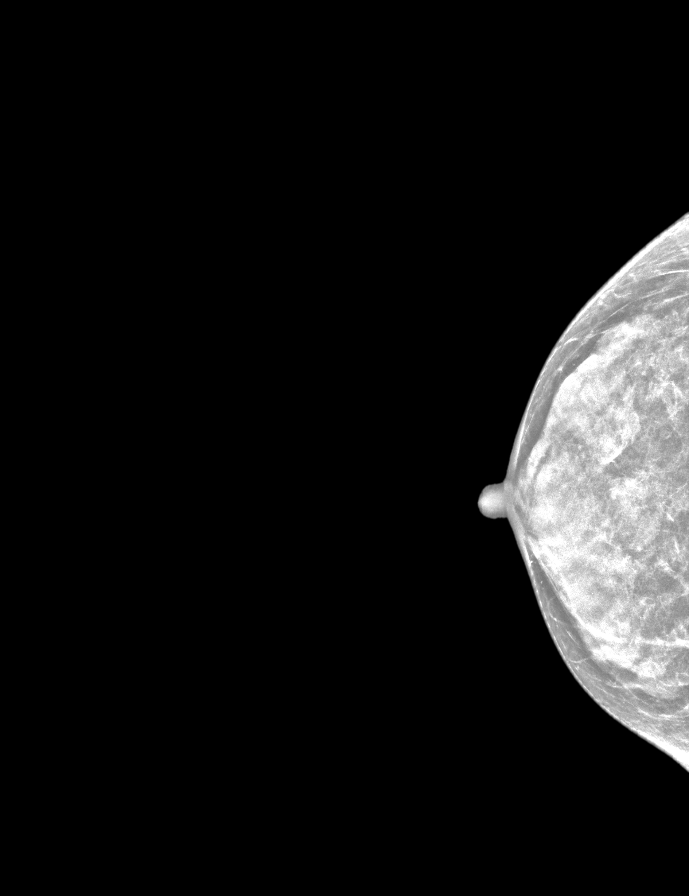

[L CC synth-2D]
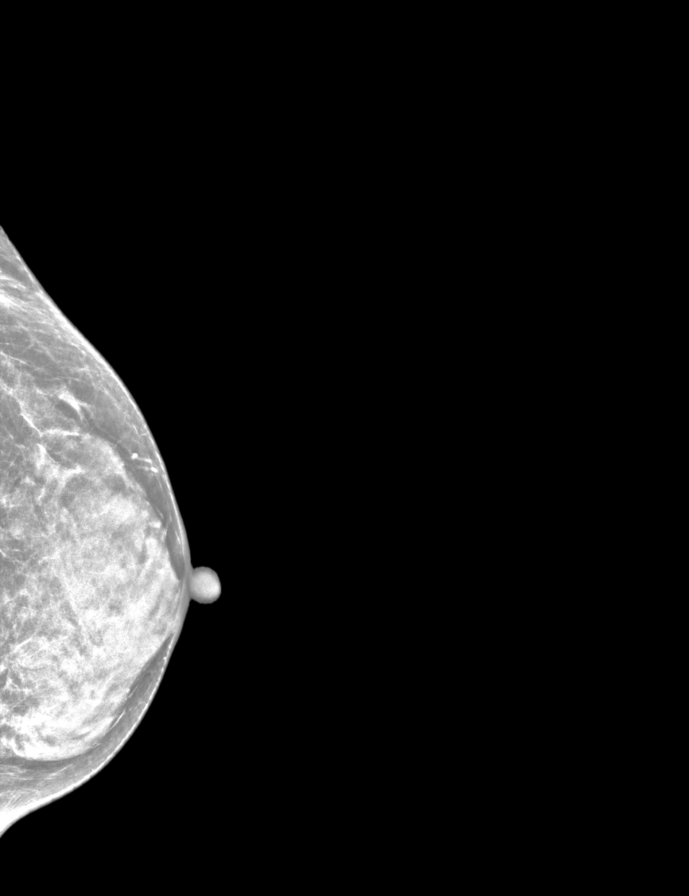

[R MLO synth-2D]
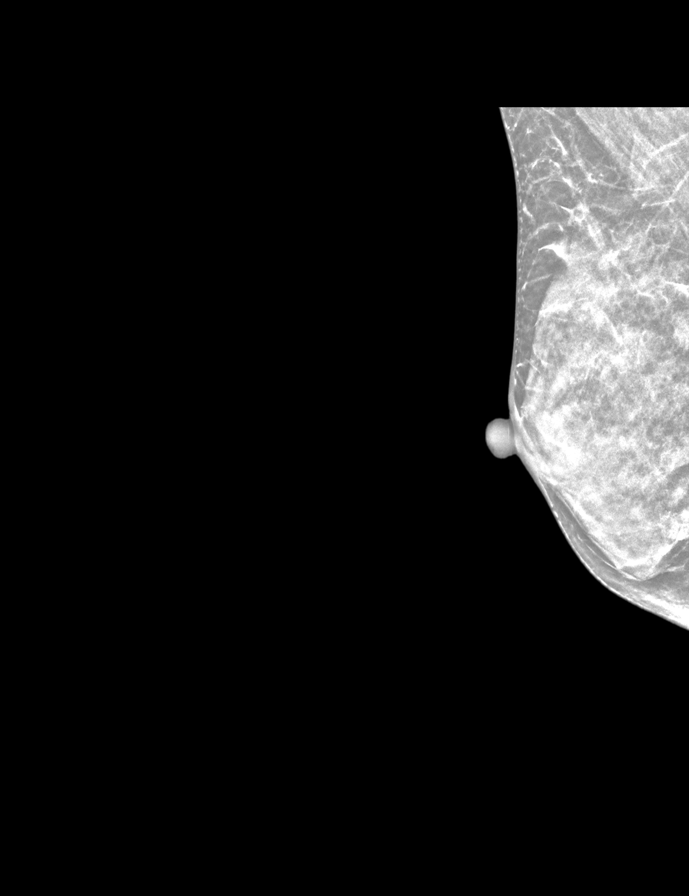

[L MLO synth-2D]
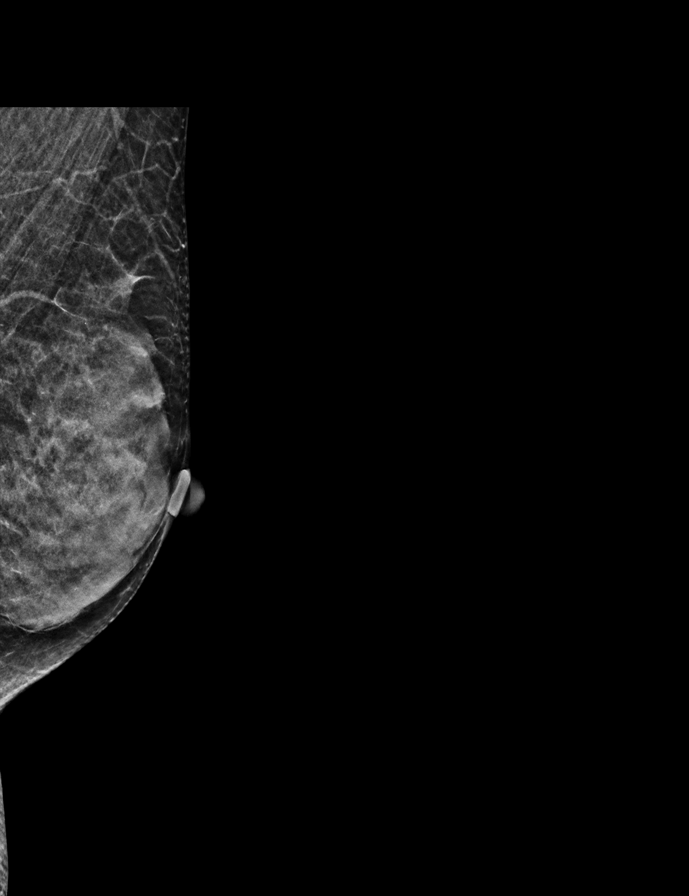

[L CC tomo · 2 of 40 frames shown]
[frame 13/40]
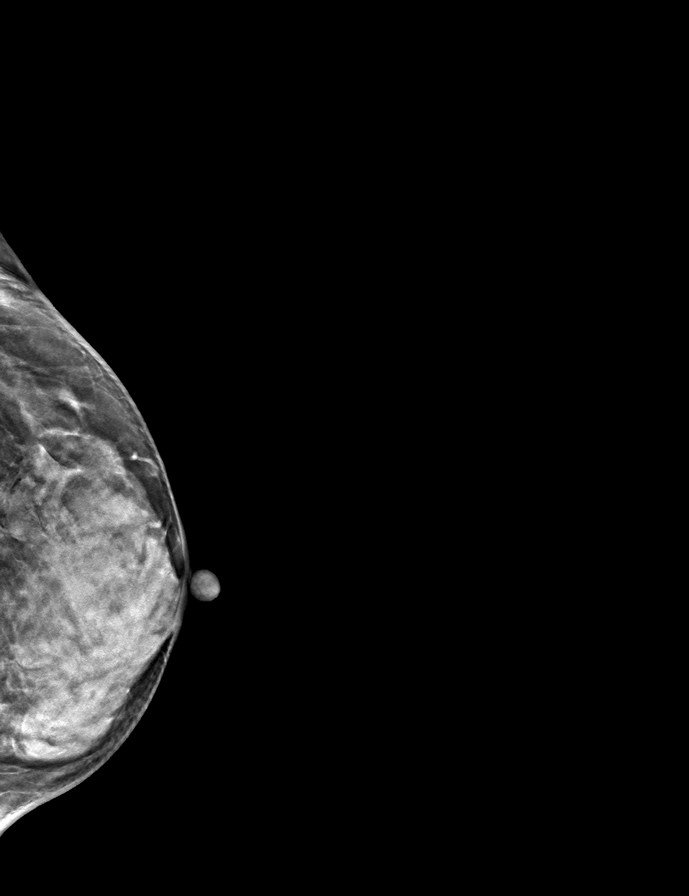
[frame 21/40]
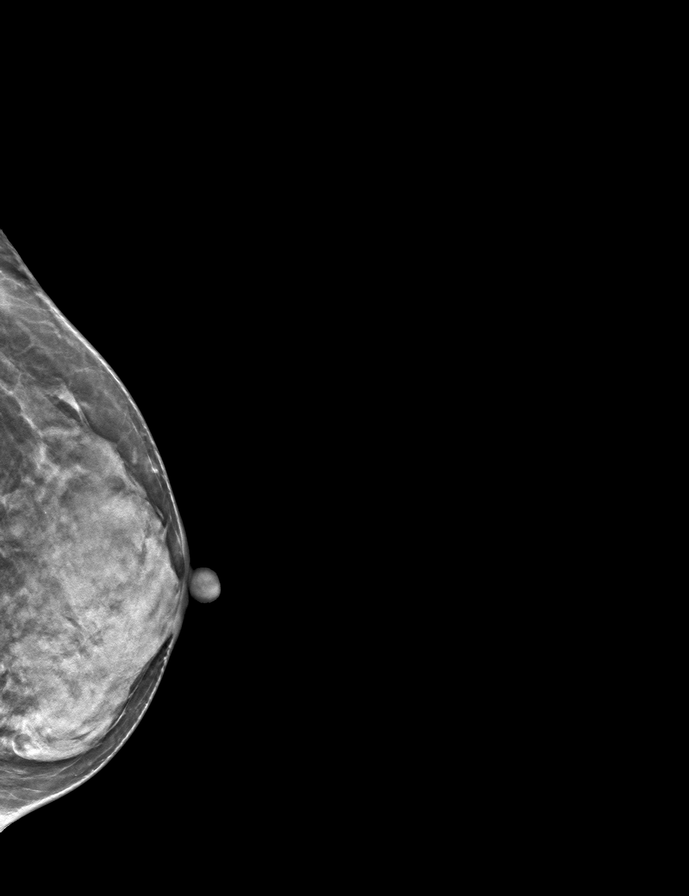

[R CC tomo · tomo slice 19/37.0]
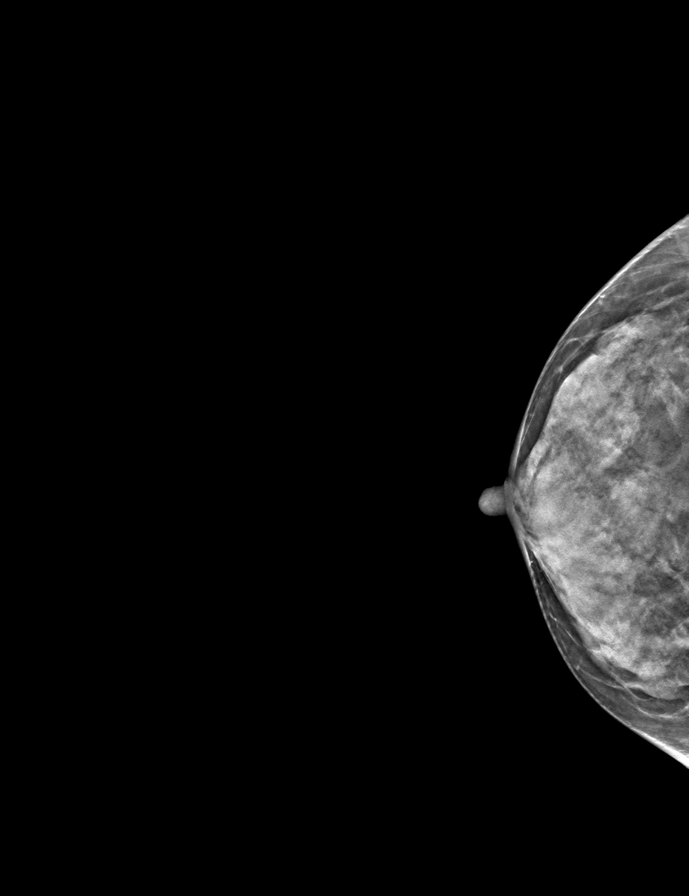

[R MLO tomo · tomo slice 19/36.0]
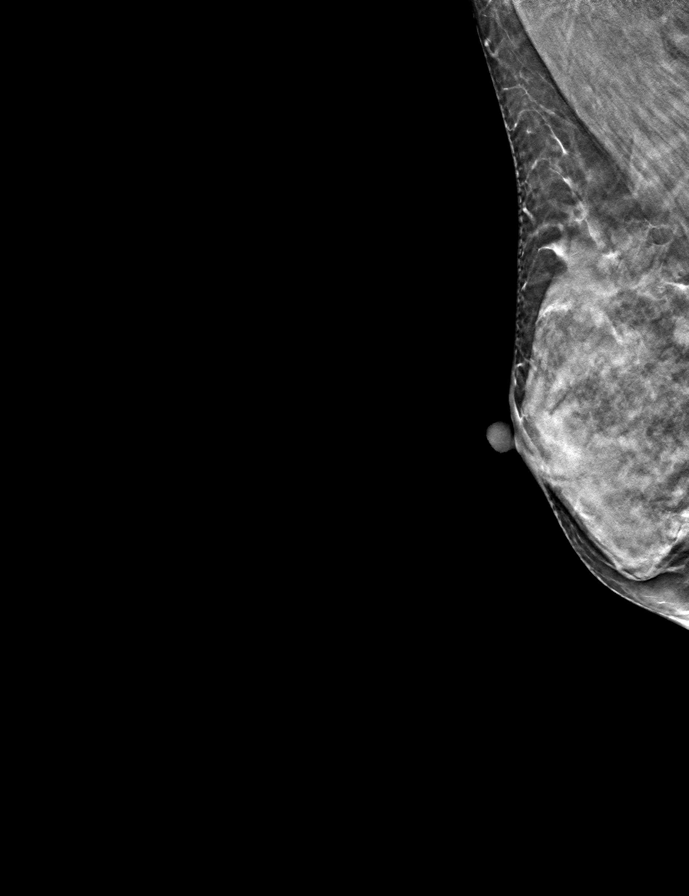

[L MLO tomo · tomo slice 19/37.0]
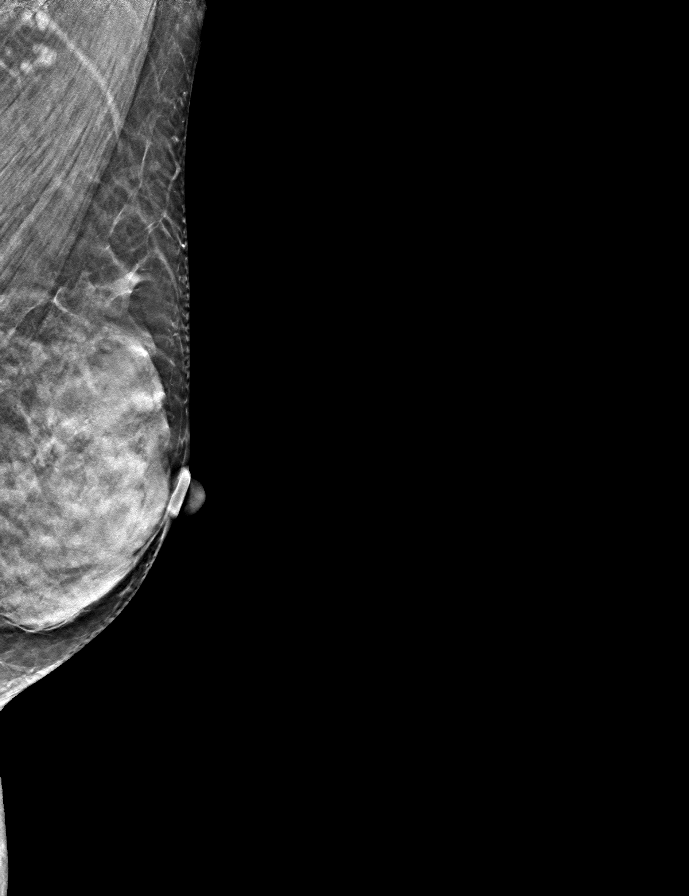

[9 of 24 positions shown; findings below may reference images not displayed]

ACR Breast Density Category d: The breast tissue is extremely dense,
which lowers the sensitivity of mammography
FINDINGS: There are no findings suspicious for malignancy. Images were
processed with CAD.
IMPRESSION: No mammographic evidence of malignancy. A result letter of this
screening mammogram will be mailed directly to the patient.

RECOMMENDATION:
Screening mammogram in one year. (Code:WO-[id number])

BI-RADS CATEGORY  1: Negative.

## 2020-11-22 ENCOUNTER — Ambulatory Visit (INDEPENDENT_AMBULATORY_CARE_PROVIDER_SITE_OTHER): Payer: BC Managed Care – PPO | Admitting: Internal Medicine

## 2020-11-22 ENCOUNTER — Other Ambulatory Visit: Payer: Self-pay

## 2020-11-22 ENCOUNTER — Encounter: Payer: Self-pay | Admitting: Internal Medicine

## 2020-11-22 VITALS — BP 98/62 | HR 64 | Temp 97.8°F | Wt 97.3 lb

## 2020-11-22 DIAGNOSIS — Z0000 Encounter for general adult medical examination without abnormal findings: Secondary | ICD-10-CM | POA: Diagnosis not present

## 2020-11-22 DIAGNOSIS — L309 Dermatitis, unspecified: Secondary | ICD-10-CM | POA: Diagnosis not present

## 2020-11-22 DIAGNOSIS — R0789 Other chest pain: Secondary | ICD-10-CM

## 2020-11-22 DIAGNOSIS — E039 Hypothyroidism, unspecified: Secondary | ICD-10-CM

## 2020-11-22 DIAGNOSIS — Z1231 Encounter for screening mammogram for malignant neoplasm of breast: Secondary | ICD-10-CM

## 2020-11-22 LAB — CBC WITH DIFFERENTIAL/PLATELET
Basophils Absolute: 0 10*3/uL (ref 0.0–0.1)
Basophils Relative: 1 % (ref 0.0–3.0)
Eosinophils Absolute: 0.3 10*3/uL (ref 0.0–0.7)
Eosinophils Relative: 7 % — ABNORMAL HIGH (ref 0.0–5.0)
HCT: 42.4 % (ref 36.0–46.0)
Hemoglobin: 13.9 g/dL (ref 12.0–15.0)
Lymphocytes Relative: 26.9 % (ref 12.0–46.0)
Lymphs Abs: 1.1 10*3/uL (ref 0.7–4.0)
MCHC: 32.8 g/dL (ref 30.0–36.0)
MCV: 88.7 fl (ref 78.0–100.0)
Monocytes Absolute: 0.3 10*3/uL (ref 0.1–1.0)
Monocytes Relative: 6.5 % (ref 3.0–12.0)
Neutro Abs: 2.4 10*3/uL (ref 1.4–7.7)
Neutrophils Relative %: 58.6 % (ref 43.0–77.0)
Platelets: 283 10*3/uL (ref 150.0–400.0)
RBC: 4.77 Mil/uL (ref 3.87–5.11)
RDW: 13.5 % (ref 11.5–15.5)
WBC: 4 10*3/uL (ref 4.0–10.5)

## 2020-11-22 LAB — COMPREHENSIVE METABOLIC PANEL
ALT: 11 U/L (ref 0–35)
AST: 22 U/L (ref 0–37)
Albumin: 4 g/dL (ref 3.5–5.2)
Alkaline Phosphatase: 43 U/L (ref 39–117)
BUN: 9 mg/dL (ref 6–23)
CO2: 33 mEq/L — ABNORMAL HIGH (ref 19–32)
Calcium: 9.3 mg/dL (ref 8.4–10.5)
Chloride: 99 mEq/L (ref 96–112)
Creatinine, Ser: 0.64 mg/dL (ref 0.40–1.20)
GFR: 96.15 mL/min (ref >60.00)
Glucose, Bld: 77 mg/dL (ref 70–99)
Potassium: 3.7 mEq/L (ref 3.5–5.1)
Sodium: 139 mEq/L (ref 135–145)
Total Bilirubin: 0.6 mg/dL (ref 0.2–1.2)
Total Protein: 7.5 g/dL (ref 6.0–8.3)

## 2020-11-22 LAB — LIPID PANEL
Cholesterol: 197 mg/dL (ref 0–200)
HDL: 70.2 mg/dL (ref >39.00)
LDL Cholesterol: 106 mg/dL — ABNORMAL HIGH (ref 0–99)
NonHDL: 126.7
Total CHOL/HDL Ratio: 3
Triglycerides: 104 mg/dL (ref 0.0–149.0)
VLDL: 20.8 mg/dL (ref 0.0–40.0)

## 2020-11-22 LAB — VITAMIN D 25 HYDROXY (VIT D DEFICIENCY, FRACTURES): VITD: 41.91 ng/mL (ref 30.00–100.00)

## 2020-11-22 LAB — TSH: TSH: 3.44 u[IU]/mL (ref 0.35–5.50)

## 2020-11-22 LAB — VITAMIN B12: Vitamin B-12: 525 pg/mL (ref 211–911)

## 2020-11-22 MED ORDER — TRIAMCINOLONE ACETONIDE 0.1 % EX CREA: 1.0000 "application " | TOPICAL_CREAM | Freq: Two times a day (BID) | CUTANEOUS | 0 refills | Status: DC

## 2020-11-22 NOTE — Progress Notes (Signed)
Established Patient Office Visit     This visit occurred during the SARS-CoV-2 public health emergency.  Safety protocols were in place, including screening questions prior to the visit, additional usage of staff PPE, and extensive cleaning of exam room while observing appropriate contact time as indicated for disinfecting solutions.    CC/Reason for Visit: Annual preventive exam, discuss concern  HPI: Traci Houston is a 60 y.o. female who is coming in today for the above mentioned reasons. Past Medical History is significant for: Hypothyroidism.  She also has had atypical chest pain and has had a full GI and cardiological work-up.  Most recently calcium CT scan score was 0 and she had an upper EGD with biopsies that were negative.  She continues to take Protonix daily.  She has routine dental care but no eye care.  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  She had a colonoscopy in 2013, Pap smear in 2021.  She has a scaly, itchy rash of her thumbs that she would like me to look at today.   Past Medical/Surgical History: Past Medical History:  Diagnosis Date   Diastolic CHF ([place]) [id number]   -mild on echo 2017 for murmur    GERD (gastroesophageal reflux disease)    Heart murmur 09/08/2015   Echo 2017: -Normal LV systolic function; grade 1 diastolic dysfunction; trace   AI; mild MR and TR.    Thyroid disease     Past Surgical History:  Procedure Laterality Date   COLONOSCOPY     ESOPHAGOGASTRODUODENOSCOPY     on membrane on eye   EYE SURGERY     UPPER GASTROINTESTINAL ENDOSCOPY      Social History:  reports that she has never smoked. She has never used smokeless tobacco. She reports that she does not drink alcohol and does not use drugs.  Allergies: No Known Allergies  Family History:  Family History  Problem Relation Age of Onset   Prostate cancer Father    Heart disease  Sister    Colon cancer Neg Hx    Breast cancer Neg Hx    Pancreatic cancer Neg Hx    Stomach cancer Neg Hx      Current Outpatient Medications:    levothyroxine (SYNTHROID) 75 MCG tablet, Take 1 tablet (75 mcg total) by mouth daily before breakfast., Disp: 90 tablet, Rfl: 1   Multiple Vitamin (MULTIVITAMIN) tablet, Take 1 tablet by mouth daily., Disp: , Rfl:    pantoprazole (PROTONIX) 40 MG tablet, TAKE 1 TABLET(40 MG) BY MOUTH TWICE DAILY, Disp: 180 tablet, Rfl: 0   triamcinolone cream (KENALOG) 0.1 %, Apply 1 application topically 2 (two) times daily., Disp: 30 g, Rfl: 0  Current Facility-Administered Medications:    0.9 %  sodium chloride infusion, 500 mL, Intravenous, Once, Jackquline Denmark, MD  Review of Systems:  Constitutional: Denies fever, chills, diaphoresis, appetite change and fatigue.  HEENT: Denies photophobia, eye pain, redness, hearing loss, ear pain, congestion, sore throat, rhinorrhea, sneezing, mouth sores, trouble swallowing, neck pain, neck stiffness and tinnitus.   Respiratory: Denies SOB, DOE, cough, chest tightness,  and wheezing.   Cardiovascular: Denies chest pain, palpitations and leg swelling.  Gastrointestinal: Denies nausea, vomiting, abdominal pain, diarrhea, constipation, blood in stool and abdominal distention.  Genitourinary: Denies dysuria, urgency, frequency, hematuria, flank pain and difficulty urinating.  Endocrine: Denies: hot or cold intolerance, sweats, changes in hair or  nails, polyuria, polydipsia. Musculoskeletal: Denies myalgias, back pain, joint swelling, arthralgias and gait problem.  Skin: Denies pallor and wound.  Neurological: Denies dizziness, seizures, syncope, weakness, light-headedness, numbness and headaches.  Hematological: Denies adenopathy. Easy bruising, personal or family bleeding history  Psychiatric/Behavioral: Denies suicidal ideation, mood changes, confusion, nervousness, sleep disturbance and agitation    Physical  Exam: Vitals:   11/22/20 0946  BP: 98/62  Pulse: 64  Temp: 97.8 F (36.6 C)  TempSrc: Oral  SpO2: 98%  Weight: 97 lb 4.8 oz (44.1 kg)    Body mass index is 17.8 kg/m.   Constitutional: NAD, calm, comfortable Eyes: PERRL, lids and conjunctivae normal ENMT: Mucous membranes are moist. Posterior pharynx clear of any exudate or lesions. Normal dentition. Tympanic membrane is pearly white, no erythema or bulging. Neck: normal, supple, no masses, no thyromegaly Respiratory: clear to auscultation bilaterally, no wheezing, no crackles. Normal respiratory effort. No accessory muscle use.  Cardiovascular: Regular rate and rhythm, no murmurs / rubs / gallops. No extremity edema. 2+ pedal pulses. No carotid bruits.  Abdomen: no tenderness, no masses palpated. No hepatosplenomegaly. Bowel sounds positive.  Musculoskeletal: no clubbing / cyanosis. No joint deformity upper and lower extremities. Good ROM, no contractures. Normal muscle tone.  Skin: no rashes, lesions, ulcers. No induration Neurologic: CN 2-12 grossly intact. Sensation intact, DTR normal. Strength 5/5 in all 4.  Psychiatric: Normal judgment and insight. Alert and oriented x 3. Normal mood.    Impression and Plan:  Encounter for preventive health examination -Advised routine eye and dental care. -She is due for flu vaccine and COVID booster but declines both today.  Shingles and Tdap are up-to-date. -Screening labs today. -Healthy lifestyle discussed in detail. -She had a colonoscopy in 2013 and is a 10-year callback. -She will is now overdue for screening mammogram, she will schedule. -She had a negative Pap smear in 2021.  Atypical chest pain  -With extensive GI and cardiac work-up that is negative, it is actually improved.  Hypothyroidism, unspecified type -  Plan: TSH  Eczema, unspecified type  - Plan: triamcinolone cream (KENALOG) 0.1 %    Patient Instructions  -Nice seeing you today!!  -Lab work today; will  notify you once results are available.  -Remember your flu vaccine.  -Triamcinolone cream twice daily for a week to your thumbs.  -Schedule follow up in 6 months.      [name], MD [facility]

## 2020-11-22 NOTE — Patient Instructions (Addendum)
-  Nice seeing you today!!  -Lab work today; will notify you once results are available.  -Remember your flu vaccine.  -Triamcinolone cream twice daily for a week to your thumbs.  -Schedule follow up in 6 months.

## 2020-11-23 ENCOUNTER — Other Ambulatory Visit: Payer: Self-pay | Admitting: Internal Medicine

## 2020-11-23 DIAGNOSIS — E039 Hypothyroidism, unspecified: Secondary | ICD-10-CM

## 2020-11-23 MED ORDER — LEVOTHYROXINE SODIUM 75 MCG PO TABS: 75.0000 ug | ORAL_TABLET | Freq: Every day | ORAL | 1 refills | Status: DC

## 2020-11-24 ENCOUNTER — Other Ambulatory Visit: Payer: Self-pay | Admitting: Internal Medicine

## 2020-11-24 ENCOUNTER — Encounter: Payer: Self-pay | Admitting: *Deleted

## 2020-11-24 DIAGNOSIS — E039 Hypothyroidism, unspecified: Secondary | ICD-10-CM

## 2020-12-12 ENCOUNTER — Telehealth: Payer: Self-pay | Admitting: Internal Medicine

## 2020-12-12 DIAGNOSIS — Z1239 Encounter for other screening for malignant neoplasm of breast: Secondary | ICD-10-CM

## 2020-12-12 NOTE — Telephone Encounter (Signed)
Patient called in stating that she was informed that she had to have a mammogram and they would call her to schedule an appointment but she hasn't received a call yet.  Patient would like to be contacted at [phone].  Please advise.

## 2020-12-13 NOTE — Telephone Encounter (Signed)
Called the patient and gave her the telephone number to the [facility].

## 2020-12-14 ENCOUNTER — Telehealth: Payer: Self-pay | Admitting: Internal Medicine

## 2020-12-14 NOTE — Telephone Encounter (Signed)
Error

## 2020-12-14 NOTE — Telephone Encounter (Signed)
Patient called again to say the [facility] told her that there was no referral in for an ultrasound and she could not schedule an appointment. I let patient know I would send message back to team so it could be sorted out and she could get appointment scheduled.     Good callback number is [phone]    Please Advise

## 2020-12-19 NOTE — Telephone Encounter (Signed)
I spoke with [facility] and they stated the pt would need to contact them to schedule appt since the order has been changed from screening to diagnostic mammogram. The representative from [facility] stated the pt called into the office and may have relayed information that caused the order to be changed. Pt has been informed of this change and has been given contact information for [facility].

## 2020-12-26 ENCOUNTER — Other Ambulatory Visit: Payer: Self-pay

## 2020-12-26 ENCOUNTER — Ambulatory Visit (INDEPENDENT_AMBULATORY_CARE_PROVIDER_SITE_OTHER): Payer: BC Managed Care – PPO

## 2020-12-26 DIAGNOSIS — Z23 Encounter for immunization: Secondary | ICD-10-CM

## 2021-01-24 ENCOUNTER — Other Ambulatory Visit: Payer: Self-pay

## 2021-01-24 ENCOUNTER — Ambulatory Visit
Admission: RE | Admit: 2021-01-24 | Discharge: 2021-01-24 | Disposition: A | Discharge status: Home / Self Care | Payer: BC Managed Care – PPO | Source: Ambulatory Visit | Attending: Internal Medicine | Admitting: Internal Medicine

## 2021-01-24 DIAGNOSIS — Z1231 Encounter for screening mammogram for malignant neoplasm of breast: Secondary | ICD-10-CM

## 2021-01-25 ENCOUNTER — Other Ambulatory Visit: Payer: Self-pay | Admitting: Internal Medicine

## 2021-01-25 DIAGNOSIS — N644 Mastodynia: Secondary | ICD-10-CM

## 2021-03-21 ENCOUNTER — Ambulatory Visit
Admission: RE | Admit: 2021-03-21 | Discharge: 2021-03-21 | Disposition: A | Discharge status: Home / Self Care | Payer: BC Managed Care – PPO | Source: Ambulatory Visit | Attending: Internal Medicine | Admitting: Internal Medicine

## 2021-03-21 DIAGNOSIS — N644 Mastodynia: Secondary | ICD-10-CM

## 2021-03-21 DIAGNOSIS — R922 Inconclusive mammogram: Secondary | ICD-10-CM | POA: Diagnosis not present

## 2021-04-27 ENCOUNTER — Other Ambulatory Visit: Payer: Self-pay | Admitting: Internal Medicine

## 2021-04-27 DIAGNOSIS — E039 Hypothyroidism, unspecified: Secondary | ICD-10-CM

## 2021-05-24 ENCOUNTER — Telehealth: Payer: Self-pay | Admitting: Internal Medicine

## 2021-05-24 MED ORDER — LEVOTHYROXINE SODIUM 75 MCG PO TABS: 75.0000 ug | ORAL_TABLET | Freq: Every day | ORAL | 1 refills | Status: DC

## 2021-05-24 NOTE — Telephone Encounter (Signed)
Pt call and stated she need a refill on levothyroxine (SYNTHROID) 75 MCG tablet  sent to  ?[facility] [place] Phone:  [phone]  ?Fax:  [phone]  ?  ? ?

## 2021-05-24 NOTE — Telephone Encounter (Signed)
Refill sent.

## 2021-06-01 DIAGNOSIS — H04123 Dry eye syndrome of bilateral lacrimal glands: Secondary | ICD-10-CM | POA: Diagnosis not present

## 2021-06-30 DIAGNOSIS — R197 Diarrhea, unspecified: Secondary | ICD-10-CM | POA: Diagnosis not present

## 2021-06-30 DIAGNOSIS — R5383 Other fatigue: Secondary | ICD-10-CM | POA: Diagnosis not present

## 2021-06-30 DIAGNOSIS — R112 Nausea with vomiting, unspecified: Secondary | ICD-10-CM | POA: Diagnosis not present

## 2021-06-30 DIAGNOSIS — Z20822 Contact with and (suspected) exposure to covid-19: Secondary | ICD-10-CM | POA: Diagnosis not present

## 2021-08-03 ENCOUNTER — Encounter: Payer: Self-pay | Admitting: Gastroenterology

## 2021-08-03 ENCOUNTER — Ambulatory Visit: Payer: BC Managed Care – PPO | Admitting: Gastroenterology

## 2021-08-03 VITALS — BP 102/72 | HR 65 | Ht 62.0 in | Wt 100.0 lb

## 2021-08-03 DIAGNOSIS — Z1211 Encounter for screening for malignant neoplasm of colon: Secondary | ICD-10-CM | POA: Diagnosis not present

## 2021-08-03 DIAGNOSIS — K580 Irritable bowel syndrome with diarrhea: Secondary | ICD-10-CM | POA: Diagnosis not present

## 2021-08-03 DIAGNOSIS — R1013 Epigastric pain: Secondary | ICD-10-CM

## 2021-08-03 MED ORDER — AMBULATORY NON FORMULARY MEDICATION: 2 refills | Status: DC

## 2021-08-03 MED ORDER — NA SULFATE-K SULFATE-MG SULF 17.5-3.13-1.6 GM/177ML PO SOLN: 1.0000 | Freq: Once | ORAL | 0 refills | Status: AC

## 2021-08-03 MED ORDER — AMBULATORY NON FORMULARY MEDICATION: 2 refills | Status: AC

## 2021-08-03 NOTE — Patient Instructions (Addendum)
If you are age 61 or older, your body mass index should be between 23-30. Your Body mass index is 18.29 kg/m. If this is out of the aforementioned range listed, please consider follow up with your Primary Care Provider.  If you are age 52 or younger, your body mass index should be between 19-25. Your Body mass index is 18.29 kg/m. If this is out of the aformentioned range listed, please consider follow up with your Primary Care Provider.   ________________________________________________________  The [employer] [facility] providers would like to encourage you to use [facility] to communicate with providers for non-urgent requests or questions.  Due to long hold times on the telephone, sending your provider a message by [facility] may be a faster and more efficient way to get a response.  Please allow 48 business hours for a response.  Please remember that this is for non-urgent requests.  _______________________________________________________  Dennis Bast have been scheduled for a colonoscopy. Please follow written instructions given to you at your visit today.  Please pick up your prep supplies at the pharmacy within the next 1-3 days. If you use inhalers (even only as needed), please bring them with you on the day of your procedure.  Continue protonix  We have sent the following medications to your pharmacy for you to pick up at your convenience: GI cocktail Suprep  [facility] information is below: Address: [address], [place], [place] [address]  [phone]  *Please DO NOT go directly from our office to pick up this medication! Give the pharmacy 1 day to process the prescription as this is compounded and takes time to make.   Thank you,  Dr. Jackquline Denmark

## 2021-08-03 NOTE — Progress Notes (Signed)
Chief Complaint: FU  Referring Provider:  Isaac Bliss, [place]*      ASSESSMENT AND PLAN;   #1.  Epigastric pain with atypical chest pain and associated anxiety. Neg EGD 08/2020, 03/2018. Neg [place] 12/2016, neg CT AP 06/17/2019. Neg CTA 05/2020, Neg GES 02/2020, neg HIDA with EF 12/2019.    #2.  IBS-D  #3. Colorectal cancer screening. Neg colon 2013.   Plan: - Continue protonix '40mg'$  po QD - Colon for colorectal cancer screening - GI cocktail 15 cc Q6hrs prn #250 cc 2RF - May need trial of anti-anxiety meds ?xanax (Dr Jerilee Hoh to decide) - If still with problems, would consider retrial of amitriptyline followed by eso manometry.    Discussed risks & benefits of colonoscopy. Risks including rare perforation req laparotomy, bleeding after bx/polypectomy req blood transfusion, rarely missing neoplasms, risks of anesthesia/sedation, rare risk of damage to internal organs. Benefits outweigh the risks. Patient agrees to proceed. All the questions were answered. Pt consents to proceed.  HPI:    Traci Houston is a 61 y.o. female  [place], history is to interpreter (green stick)  Occ diarrhea, occ blood, lot of gas  Very frustrated with her symptoms-recent negative EGD with Neg gastric Bx for HP/ Sbx for celiac/ eso Bx for EoE.  Had negative cardiac eval. Still with atypical chest pains.  Protonix twice daily did help, has been reduced to QD.  Extensive negative GI eval.  Do feel anxiety is playing a significant role.  Having intermittent diarrhea, mostly after eating.  Some abdominal bloating.  Stool does "change color".  No melena or hematochezia.  Milk and cheese would make abdominal bloating worse.  She previously had history of constipation with pellet-like stools.  She is due for colorectal cancer screening.     From previous notes:  Had longstanding chest pain/epi pain with radiation to back x 3-4 yrs, getting worse over the last 6 months.  Food, no matter what kind  makes it worse.  She denies having any nausea or vomiting.  She does give history of regurgitation.  Her heartburn is completely resolved with Protonix.  She has increased Protonix to 40 mg p.o. twice daily but continues to have problems.  No dysphagia or odynophagia. Seen in ED on 05/18/2020 and had negative CTA chest Abdo/pelvis. Had negative evaluation as above including EGD, colonoscopy, ultrasound, HIDA with ejection fraction, gastric emptying scan. Failed multiple medications including nortriptyline, IBgard, fiber.    Recent GI procedures: EGD 08/2020 - Minimal gastritis. -Neg gastric Bx for HP/ Sbx for celiac/ eso Bx for EoE  Wt Readings from Last 3 Encounters:  08/03/21 100 lb (45.4 kg)  11/22/20 97 lb 4.8 oz (44.1 kg)  10/17/20 96 lb (43.5 kg)    Past Medical History:  Diagnosis Date   Diastolic CHF ([place]) 9/38/1829   -mild on echo 2017 for murmur    GERD (gastroesophageal reflux disease)    Heart murmur 09/08/2015   Echo 2017: -Normal LV systolic function; grade 1 diastolic dysfunction; trace   AI; mild MR and TR.    Thyroid disease     Past Surgical History:  Procedure Laterality Date   COLONOSCOPY     ESOPHAGOGASTRODUODENOSCOPY     on membrane on eye   EYE SURGERY     UPPER GASTROINTESTINAL ENDOSCOPY      Family History  Problem Relation Age of Onset   Prostate cancer Father    Heart disease Sister    Colon cancer Neg Hx  Breast cancer Neg Hx    Pancreatic cancer Neg Hx    Stomach cancer Neg Hx     Social History   Tobacco Use   Smoking status: Never   Smokeless tobacco: Never  Vaping Use   Vaping Use: Never used  Substance Use Topics   Alcohol use: No   Drug use: No    Current Outpatient Medications  Medication Sig Dispense Refill   levothyroxine (SYNTHROID) 75 MCG tablet Take 1 tablet (75 mcg total) by mouth daily before breakfast. 90 tablet 1   Multiple Vitamin (MULTIVITAMIN) tablet Take 1 tablet by mouth daily.     pantoprazole (PROTONIX) 40  MG tablet TAKE 1 TABLET(40 MG) BY MOUTH TWICE DAILY (Patient taking differently: 40 mg daily.) 180 tablet 0   triamcinolone cream (KENALOG) 0.1 % Apply 1 application topically 2 (two) times daily. (Patient taking differently: Apply 1 application. topically as needed.) 30 g 0   Current Facility-Administered Medications  Medication Dose Route Frequency Provider Last Rate Last Admin   0.9 %  sodium chloride infusion  500 mL Intravenous Once Jackquline Denmark, MD        No Known Allergies  Review of Systems:   Psychiatric/Behavioral: Has anxiety or depression     Physical Exam:    BP 102/72   Pulse 65   Ht '5\' 2"'$  (1.575 m)   Wt 100 lb (45.4 kg)   LMP 08/10/2011   BMI 18.29 kg/m  Wt Readings from Last 3 Encounters:  08/03/21 100 lb (45.4 kg)  11/22/20 97 lb 4.8 oz (44.1 kg)  10/17/20 96 lb (43.5 kg)   Constitutional: Thin built, in no acute distress. Psychiatric: Normal mood and affect. Behavior is normal. HEENT: Pupils normal.  Conjunctivae are normal. No scleral icterus. Cardiovascular: Normal rate, regular rhythm. No edema Pulmonary/chest: Effort normal and breath sounds normal. No wheezing, rales or rhonchi. Abdominal: Soft, nondistended.  Epigastric and left lower quadrant abdominal tenderness bowel sounds active throughout. There are no masses palpable. No hepatomegaly. Rectal:  defered Neurological: Alert and oriented to person place and time. Skin: Skin is warm and dry. No rashes noted.  Data Reviewed: I have personally reviewed following labs and imaging studies  CBC:    Latest Ref Rng & Units 11/22/2020   10:24 AM 05/18/2020   10:31 AM 06/10/2019   10:32 AM  CBC  WBC 4.0 - 10.5 K/uL 4.0   4.8   4.0    Hemoglobin 12.0 - 15.0 g/dL 13.9   13.3   14.0    Hematocrit 36.0 - 46.0 % 42.4   42.1   43.1    Platelets 150.0 - 400.0 K/uL 283.0   307   339.0      CMP:    Latest Ref Rng & Units 11/22/2020   10:24 AM 05/18/2020   12:56 PM 05/18/2020   10:31 AM  CMP  Glucose 70  - 99 mg/dL 77    63    BUN 6 - 23 mg/dL 9    8    Creatinine 0.40 - 1.20 mg/dL 0.64    0.65    Sodium 135 - 145 mEq/L 139    137    Potassium 3.5 - 5.1 mEq/L 3.7    3.2    Chloride 96 - 112 mEq/L 99    101    CO2 19 - 32 mEq/L 33    28    Calcium 8.4 - 10.5 mg/dL 9.3    9.2    Total  Protein 6.0 - 8.3 g/dL 7.5   7.3     Total Bilirubin 0.2 - 1.2 mg/dL 0.6   1.0     Alkaline Phos 39 - 117 U/L 43   40     AST 0 - 37 U/L 22   21     ALT 0 - 35 U/L 11   12         Carmell Austria, MD 08/03/2021, 10:45 AM  Cc: Isaac Bliss, [place]*

## 2021-09-10 ENCOUNTER — Encounter: Payer: Self-pay | Admitting: Gastroenterology

## 2021-09-17 ENCOUNTER — Encounter: Payer: Self-pay | Admitting: Gastroenterology

## 2021-09-17 ENCOUNTER — Ambulatory Visit (AMBULATORY_SURGERY_CENTER): Payer: BC Managed Care – PPO | Admitting: Gastroenterology

## 2021-09-17 VITALS — BP 101/58 | HR 77 | Temp 98.6°F | Resp 18 | Ht 62.0 in | Wt 100.0 lb

## 2021-09-17 DIAGNOSIS — Z1211 Encounter for screening for malignant neoplasm of colon: Secondary | ICD-10-CM

## 2021-09-17 MED ORDER — SODIUM CHLORIDE 0.9 % IV SOLN: 500.0000 mL | Freq: Once | INTRAVENOUS | Status: AC

## 2021-09-17 NOTE — Progress Notes (Signed)
PT taken to PACU. Monitors in place. VSS. Report given to RN. 

## 2021-09-17 NOTE — Progress Notes (Signed)
Vitals-AS  Pt's states no medical or surgical changes since previsit or office visit.   Speak slowly.  Patient dies understand ENglish.

## 2021-09-17 NOTE — Patient Instructions (Signed)
YOU HAD AN ENDOSCOPIC PROCEDURE TODAY AT [facility]:   Refer to the procedure report that was given to you for any specific questions about what was found during the examination.  If the procedure report does not answer your questions, please call your gastroenterologist to clarify.  If you requested that your care partner not be given the details of your procedure findings, then the procedure report has been included in a sealed envelope for you to review at your convenience later.  YOU SHOULD EXPECT: Some feelings of bloating in the abdomen. Passage of more gas than usual.  Walking can help get rid of the air that was put into your GI tract during the procedure and reduce the bloating. If you had a lower endoscopy (such as a colonoscopy or flexible sigmoidoscopy) you may notice spotting of blood in your stool or on the toilet paper. If you underwent a bowel prep for your procedure, you may not have a normal bowel movement for a few days.  Please Note:  You might notice some irritation and congestion in your nose or some drainage.  This is from the oxygen used during your procedure.  There is no need for concern and it should clear up in a day or so.  SYMPTOMS TO REPORT IMMEDIATELY:  Following lower endoscopy (colonoscopy or flexible sigmoidoscopy):  Excessive amounts of blood in the stool  Significant tenderness or worsening of abdominal pains  Swelling of the abdomen that is new, acute  Fever of 100F or higher   For urgent or emergent issues, a gastroenterologist can be reached at any hour by calling [phone]. Do not use MyChart messaging for urgent concerns.    DIET:  We do recommend a small meal at first, but then you may proceed to your regular diet.  Drink plenty of fluids but you should avoid alcoholic beverages for 24 hours.  MEDICATIONS: Continue present medications.  Please see handouts given to you by your recovery nurse.  Thank you for allowing [place] to  provide for your healthcare needs today.  ACTIVITY:  You should plan to take it easy for the rest of today and you should NOT DRIVE or use heavy machinery until tomorrow (because of the sedation medicines used during the test).    FOLLOW UP: Our staff will call the number listed on your records the next business day following your procedure.  We will call around 7:15- 8:00 am to check on you and address any questions or concerns that you may have regarding the information given to you following your procedure. If we do not reach you, we will leave a message.  If you develop any symptoms (ie: fever, flu-like symptoms, shortness of breath, cough etc.) before then, please call [phone].  If you test positive for Covid 19 in the 2 weeks post procedure, please call and report this information to [place].    If any biopsies were taken you will be contacted by phone or by letter within the next 1-3 weeks.  Please call us at [phone] if you have not heard about the biopsies in 3 weeks.    SIGNATURES/CONFIDENTIALITY: You and/or your care partner have signed paperwork which will be entered into your electronic medical record.  These signatures attest to the fact that that the information above on your After Visit Summary has been reviewed and is understood.  Full responsibility of the confidentiality of this discharge information lies with you and/or your care-partner.

## 2021-09-17 NOTE — Op Note (Signed)
[facility] Patient Name: Traci Houston Procedure Date: 09/17/2021 1:28 PM MRN: [id number] Endoscopist: Jackquline Denmark , MD Age: 61 Referring MD:  Date of Birth: 1960/06/28 Gender: Female Account #: [id number] Procedure:                Colonoscopy Indications:              Screening for colorectal malignant neoplasm Medicines:                Monitored Anesthesia Care Procedure:                Pre-Anesthesia Assessment:                           - Prior to the procedure, a History and Physical                            was performed, and patient medications and                            allergies were reviewed. The patient's tolerance of                            previous anesthesia was also reviewed. The risks                            and benefits of the procedure and the sedation                            options and risks were discussed with the patient.                            All questions were answered, and informed consent                            was obtained. Prior Anticoagulants: The patient has                            taken no previous anticoagulant or antiplatelet                            agents. ASA Grade Assessment: II - A patient with                            mild systemic disease. After reviewing the risks                            and benefits, the patient was deemed in                            satisfactory condition to undergo the procedure.                           After obtaining informed consent, the colonoscope  was passed under direct vision. Throughout the                            procedure, the patient's blood pressure, pulse, and                            oxygen saturations were monitored continuously. The                            Olympus PCF-H190DL (#[id number]) Colonoscope was                            introduced through the anus and advanced to the 2                            cm into the ileum. The  colonoscopy was performed                            without difficulty. The patient tolerated the                            procedure well. The quality of the bowel                            preparation was good. The terminal ileum, ileocecal                            valve, appendiceal orifice, and rectum were                            photographed. Scope In: 1:37:30 PM Scope Out: 1:47:55 PM Scope Withdrawal Time: 0 hours 6 minutes 56 seconds  Total Procedure Duration: 0 hours 10 minutes 25 seconds  Findings:                 The colon (entire examined portion) appeared normal.                           Non-bleeding internal hemorrhoids were found during                            retroflexion. The hemorrhoids were small and Grade                            I (internal hemorrhoids that do not prolapse).                           The terminal ileum appeared normal.                           The exam was otherwise without abnormality on                            direct and retroflexion views. Complications:            No  immediate complications. Estimated Blood Loss:     Estimated blood loss: none. Impression:               - The entire examined colon is normal.                           - Non-bleeding internal hemorrhoids.                           - The examined portion of the ileum was normal.                           - The examination was otherwise normal on direct                            and retroflexion views.                           - No specimens collected. Recommendation:           - Patient has a contact number available for                            emergencies. The signs and symptoms of potential                            delayed complications were discussed with the                            patient. Return to normal activities tomorrow.                            Written discharge instructions were provided to the                            patient.                            - Resume previous diet.                           - Continue present medications.                           - Repeat colonoscopy in 10 years for screening                            purposes. Earlier, if any problems or change in [place].                           - The findings and recommendations were discussed                            with the patient's family. Jackquline Denmark, MD 09/17/2021 1:53:02 PM This report has been signed electronically.

## 2021-09-17 NOTE — Progress Notes (Signed)
[facility] History and Physical   Primary Care Physician:  Isaac Bliss, Rayford Halsted, MD   Reason for Procedure:   CRC screening  Plan:    colon     HPI: Traci Houston is a 61 y.o. female   No nausea, vomiting, heartburn, regurgitation, odynophagia or dysphagia.  No significant diarrhea or constipation.  No melena or hematochezia. No unintentional weight loss. No abdominal pain.  Past Medical History:  Diagnosis Date   Diastolic CHF ([place]) 9/32/6712   -mild on echo 2017 for murmur    GERD (gastroesophageal reflux disease)    Heart murmur 09/08/2015   Echo 2017: -Normal LV systolic function; grade 1 diastolic dysfunction; trace   AI; mild MR and TR.    Thyroid disease     Past Surgical History:  Procedure Laterality Date   COLONOSCOPY     ESOPHAGOGASTRODUODENOSCOPY     on membrane on eye   EYE SURGERY     UPPER GASTROINTESTINAL ENDOSCOPY      Prior to Admission medications   Medication Sig Start Date End Date Taking? Authorizing Provider  levothyroxine (SYNTHROID) 75 MCG tablet Take 1 tablet (75 mcg total) by mouth daily before breakfast. 05/24/21  Yes Isaac Bliss, Rayford Halsted, MD  Multiple Vitamin (MULTIVITAMIN) tablet Take 1 tablet by mouth daily.   Yes [provider]  pantoprazole (PROTONIX) 40 MG tablet TAKE 1 TABLET(40 MG) BY MOUTH TWICE DAILY Patient taking differently: 40 mg daily. 10/31/20  Yes Isaac Bliss, Rayford Halsted, MD  triamcinolone cream (KENALOG) 0.1 % Apply 1 application topically 2 (two) times daily. Patient taking differently: Apply 1 application  topically as needed. 11/22/20  Yes Isaac Bliss, Rayford Halsted, MD  AMBULATORY NON FORMULARY MEDICATION Medication Name: GI Cocktail equal parts of dicyclomine, maalox, 2% viscous lidocaine Take 28m every 6 hours as needed 08/03/21   GJackquline Denmark MD    Current Outpatient Medications  Medication Sig Dispense Refill   levothyroxine (SYNTHROID) 75 MCG tablet Take 1 tablet (75 mcg  total) by mouth daily before breakfast. 90 tablet 1   Multiple Vitamin (MULTIVITAMIN) tablet Take 1 tablet by mouth daily.     pantoprazole (PROTONIX) 40 MG tablet TAKE 1 TABLET(40 MG) BY MOUTH TWICE DAILY (Patient taking differently: 40 mg daily.) 180 tablet 0   triamcinolone cream (KENALOG) 0.1 % Apply 1 application topically 2 (two) times daily. (Patient taking differently: Apply 1 application  topically as needed.) 30 g 0   AMBULATORY NON FORMULARY MEDICATION Medication Name: GI Cocktail equal parts of dicyclomine, maalox, 2% viscous lidocaine Take 15mevery 6 hours as needed 240 mL 2   Current Facility-Administered Medications  Medication Dose Route Frequency Provider Last Rate Last Admin   0.9 %  sodium chloride infusion  500 mL Intravenous Once GuJackquline DenmarkMD       0.9 %  sodium chloride infusion  500 mL Intravenous Once GuJackquline DenmarkMD        Allergies as of 09/17/2021 - Review Complete 09/17/2021  Allergen Reaction Noted   No known allergies  09/17/2021    Family History  Problem Relation Age of Onset   Prostate cancer Father    Heart disease Sister    Colon cancer Neg Hx    Breast cancer Neg Hx    Pancreatic cancer Neg Hx    Stomach cancer Neg Hx    Esophageal cancer Neg Hx     Social History   Socioeconomic History   Marital status: Married  Spouse name: Not on file   Number of children: 2   Years of education: Not on file   Highest education level: Not on file  Occupational History    Employer: [employer]  Tobacco Use   Smoking status: Never   Smokeless tobacco: Never  Vaping Use   Vaping Use: Never used  Substance and Sexual Activity   Alcohol use: No   Drug use: No   Sexual activity: Not Currently    Birth control/protection: Post-menopausal  Other Topics Concern   Not on file  Social History Narrative   Patient is right-handed. She lives in a one level home, her daughter lives with her. She does not drink caffeine. She does not exercise, but  states is active at work.   Social Determinants of Health   Financial Resource Strain: Not on file  Food Insecurity: Not on file  Transportation Needs: Not on file  Physical Activity: Not on file  Stress: Not on file  Social Connections: Not on file  Intimate Partner Violence: Not on file    Review of Systems: Positive for none All other review of systems negative except as mentioned in the HPI.  Physical Exam: Vital signs in last 24 hours: '@VSRANGES'$ @   General:   Alert,  Well-developed, well-nourished, pleasant and cooperative in NAD Lungs:  Clear throughout to auscultation.   Heart:  Regular rate and rhythm; no murmurs, clicks, rubs,  or gallops. Abdomen:  Soft, nontender and nondistended. Normal bowel sounds.   Neuro/Psych:  Alert and cooperative. Normal mood and affect. A and O x 3    No significant changes were identified.  The patient continues to be an appropriate candidate for the planned procedure and anesthesia.   Carmell Austria, MD. [facility] 09/17/2021 1:33 PM@

## 2021-09-18 ENCOUNTER — Telehealth: Payer: Self-pay

## 2021-09-18 NOTE — Telephone Encounter (Signed)
  Follow up Call-     09/17/2021   12:32 PM 09/17/2021   12:23 PM 08/11/2020   12:47 PM  Call back number  Post procedure Call Back phone  # [phone]  [phone]  Permission to leave phone message  Yes      Patient questions:  Do you have a fever, pain , or abdominal swelling? No. Pain Score  0 *  Have you tolerated food without any problems? Yes.    Have you been able to return to your normal activities? Yes.    Do you have any questions about your discharge instructions: Diet   No. Medications  No. Follow up visit  No.  Do you have questions or concerns about your Care? No.  Actions: * If pain score is 4 or above: No action needed, pain <4.

## 2021-12-04 ENCOUNTER — Encounter: Payer: Self-pay | Admitting: Internal Medicine

## 2021-12-04 ENCOUNTER — Ambulatory Visit (INDEPENDENT_AMBULATORY_CARE_PROVIDER_SITE_OTHER): Payer: BC Managed Care – PPO | Admitting: Internal Medicine

## 2021-12-04 VITALS — BP 110/70 | HR 75 | Temp 98.2°F | Ht 61.0 in | Wt 96.5 lb

## 2021-12-04 DIAGNOSIS — M25561 Pain in right knee: Secondary | ICD-10-CM

## 2021-12-04 DIAGNOSIS — M25562 Pain in left knee: Secondary | ICD-10-CM

## 2021-12-04 DIAGNOSIS — L309 Dermatitis, unspecified: Secondary | ICD-10-CM

## 2021-12-04 DIAGNOSIS — G8929 Other chronic pain: Secondary | ICD-10-CM

## 2021-12-04 DIAGNOSIS — Z0000 Encounter for general adult medical examination without abnormal findings: Secondary | ICD-10-CM

## 2021-12-04 DIAGNOSIS — Z23 Encounter for immunization: Secondary | ICD-10-CM

## 2021-12-04 DIAGNOSIS — E039 Hypothyroidism, unspecified: Secondary | ICD-10-CM | POA: Diagnosis not present

## 2021-12-04 DIAGNOSIS — E559 Vitamin D deficiency, unspecified: Secondary | ICD-10-CM

## 2021-12-04 LAB — CBC WITH DIFFERENTIAL/PLATELET
Basophils Absolute: 0 10*3/uL (ref 0.0–0.1)
Basophils Relative: 1.1 % (ref 0.0–3.0)
Eosinophils Absolute: 0.1 10*3/uL (ref 0.0–0.7)
Eosinophils Relative: 1.9 % (ref 0.0–5.0)
HCT: 40.6 % (ref 36.0–46.0)
Hemoglobin: 13.5 g/dL (ref 12.0–15.0)
Lymphocytes Relative: 26.2 % (ref 12.0–46.0)
Lymphs Abs: 1 10*3/uL (ref 0.7–4.0)
MCHC: 33.4 g/dL (ref 30.0–36.0)
MCV: 89.7 fl (ref 78.0–100.0)
Monocytes Absolute: 0.3 10*3/uL (ref 0.1–1.0)
Monocytes Relative: 8.7 % (ref 3.0–12.0)
Neutro Abs: 2.4 10*3/uL (ref 1.4–7.7)
Neutrophils Relative %: 62.1 % (ref 43.0–77.0)
Platelets: 293 10*3/uL (ref 150.0–400.0)
RBC: 4.52 Mil/uL (ref 3.87–5.11)
RDW: 13.3 % (ref 11.5–15.5)
WBC: 3.9 10*3/uL — ABNORMAL LOW (ref 4.0–10.5)

## 2021-12-04 LAB — TSH: TSH: 3.05 u[IU]/mL (ref 0.35–5.50)

## 2021-12-04 LAB — COMPREHENSIVE METABOLIC PANEL
ALT: 8 U/L (ref 0–35)
AST: 18 U/L (ref 0–37)
Albumin: 4.1 g/dL (ref 3.5–5.2)
Alkaline Phosphatase: 42 U/L (ref 39–117)
BUN: 9 mg/dL (ref 6–23)
CO2: 32 mEq/L (ref 19–32)
Calcium: 9.3 mg/dL (ref 8.4–10.5)
Chloride: 101 mEq/L (ref 96–112)
Creatinine, Ser: 0.67 mg/dL (ref 0.40–1.20)
GFR: 94.41 mL/min (ref >60.00)
Glucose, Bld: 89 mg/dL (ref 70–99)
Potassium: 3.6 mEq/L (ref 3.5–5.1)
Sodium: 139 mEq/L (ref 135–145)
Total Bilirubin: 0.8 mg/dL (ref 0.2–1.2)
Total Protein: 7.6 g/dL (ref 6.0–8.3)

## 2021-12-04 LAB — LIPID PANEL
Cholesterol: 197 mg/dL (ref 0–200)
HDL: 57.6 mg/dL (ref >39.00)
LDL Cholesterol: 117 mg/dL — ABNORMAL HIGH (ref 0–99)
NonHDL: 139.76
Total CHOL/HDL Ratio: 3
Triglycerides: 115 mg/dL (ref 0.0–149.0)
VLDL: 23 mg/dL (ref 0.0–40.0)

## 2021-12-04 LAB — VITAMIN D 25 HYDROXY (VIT D DEFICIENCY, FRACTURES): VITD: 52.82 ng/mL (ref 30.00–100.00)

## 2021-12-04 MED ORDER — LEVOTHYROXINE SODIUM 75 MCG PO TABS: 75.0000 ug | ORAL_TABLET | Freq: Every day | ORAL | 1 refills | Status: DC

## 2021-12-04 MED ORDER — TRIAMCINOLONE ACETONIDE 0.1 % EX CREA: 1.0000 | TOPICAL_CREAM | Freq: Two times a day (BID) | CUTANEOUS | 0 refills | Status: DC

## 2021-12-04 NOTE — Progress Notes (Signed)
Established Patient Office Visit     CC/Reason for Visit: Annual preventive exam, discuss acute concerns  HPI: Traci Houston is a 61 y.o. female who is coming in today for the above mentioned reasons. Past Medical History is significant for: Hypothyroidism and vitamin D deficiency.  She has routine eye and dental exams.  She has been exercising by walking on a daily basis.  Cancer screening is up-to-date.  She is due for flu and a COVID-vaccine.  She has been complaining of a slight eczematous rash over her right thumb and wrist.  She also has bilateral knee pain that has been present for months.  It is especially painful to walk up steps.   Past Medical/Surgical History: Past Medical History:  Diagnosis Date   Diastolic CHF ([place]) 0/86/5784   -mild on echo 2017 for murmur    GERD (gastroesophageal reflux disease)    Heart murmur 09/08/2015   Echo 2017: -Normal LV systolic function; grade 1 diastolic dysfunction; trace   AI; mild MR and TR.    Thyroid disease     Past Surgical History:  Procedure Laterality Date   COLONOSCOPY     ESOPHAGOGASTRODUODENOSCOPY     on membrane on eye   EYE SURGERY     UPPER GASTROINTESTINAL ENDOSCOPY      Social History:  reports that she has never smoked. She has never used smokeless tobacco. She reports that she does not drink alcohol and does not use drugs.  Allergies: Allergies  Allergen Reactions   No Known Allergies     Family History:  Family History  Problem Relation Age of Onset   Prostate cancer Father    Heart disease Sister    Colon cancer Neg Hx    Breast cancer Neg Hx    Pancreatic cancer Neg Hx    Stomach cancer Neg Hx    Esophageal cancer Neg Hx      Current Outpatient Medications:    AMBULATORY NON FORMULARY MEDICATION, Medication Name: GI Cocktail equal parts of dicyclomine, maalox, 2% viscous lidocaine Take 77m every 6 hours as needed, Disp: 240 mL, Rfl: 2   Multiple Vitamin (MULTIVITAMIN) tablet, Take 1  tablet by mouth daily., Disp: , Rfl:    pantoprazole (PROTONIX) 40 MG tablet, TAKE 1 TABLET(40 MG) BY MOUTH TWICE DAILY (Patient taking differently: 40 mg daily.), Disp: 180 tablet, Rfl: 0   triamcinolone cream (KENALOG) 0.1 %, Apply 1 Application topically 2 (two) times daily., Disp: 30 g, Rfl: 0   levothyroxine (SYNTHROID) 75 MCG tablet, Take 1 tablet (75 mcg total) by mouth daily before breakfast., Disp: 90 tablet, Rfl: 1  Current Facility-Administered Medications:    0.9 %  sodium chloride infusion, 500 mL, Intravenous, Once, GJackquline Denmark MD   0.9 %  sodium chloride infusion, 500 mL, Intravenous, Once, GJackquline Denmark MD  Review of Systems:  Constitutional: Denies fever, chills, diaphoresis, appetite change and fatigue.  HEENT: Denies photophobia, eye pain, redness, hearing loss, ear pain, congestion, sore throat, rhinorrhea, sneezing, mouth sores, trouble swallowing, neck pain, neck stiffness and tinnitus.   Respiratory: Denies SOB, DOE, cough, chest tightness,  and wheezing.   Cardiovascular: Denies chest pain, palpitations and leg swelling.  Gastrointestinal: Denies nausea, vomiting, abdominal pain, diarrhea, constipation, blood in stool and abdominal distention.  Genitourinary: Denies dysuria, urgency, frequency, hematuria, flank pain and difficulty urinating.  Endocrine: Denies: hot or cold intolerance, sweats, changes in hair or nails, polyuria, polydipsia. Musculoskeletal: Denies myalgias,  joint swelling. Skin: Denies  pallor and wound.  Neurological: Denies dizziness, seizures, syncope, weakness, light-headedness, numbness and headaches.  Hematological: Denies adenopathy. Easy bruising, personal or family bleeding history  Psychiatric/Behavioral: Denies suicidal ideation, mood changes, confusion, nervousness, sleep disturbance and agitation    Physical Exam: Vitals:   12/04/21 0952  BP: 110/70  Pulse: 75  Temp: 98.2 F (36.8 C)  TempSrc: Oral  SpO2: 99%  Weight: 96 lb 8  oz (43.8 kg)  Height: '5\' 1"'$  (1.549 m)    Body mass index is 18.23 kg/m.   Constitutional: NAD, calm, comfortable Eyes: PERRL, lids and conjunctivae normal ENMT: Mucous membranes are moist. Posterior pharynx clear of any exudate or lesions. Normal dentition. Tympanic membrane is pearly white, no erythema or bulging. Neck: normal, supple, no masses, no thyromegaly Respiratory: clear to auscultation bilaterally, no wheezing, no crackles. Normal respiratory effort. No accessory muscle use.  Cardiovascular: Regular rate and rhythm, no murmurs / rubs / gallops. No extremity edema. 2+ pedal pulses. No carotid bruits.  Abdomen: no tenderness, no masses palpated. No hepatosplenomegaly. Bowel sounds positive.  Musculoskeletal: no clubbing / cyanosis. No joint deformity upper and lower extremities. Good ROM, no contractures. Normal muscle tone.  Some crepitus to extension of bilateral knees, no appreciable joint fluid. Skin: no  lesions, ulcers. No induration.  Eczematous rash over left thumb. Neurologic: CN 2-12 grossly intact. Sensation intact, DTR normal. Strength 5/5 in all 4.  Psychiatric: Normal judgment and insight. Alert and oriented x 3. Normal mood.    Impression and Plan:  Encounter for preventive health examination - Plan: CBC with Differential/Platelet, Comprehensive metabolic panel, Lipid panel, Lipid panel, Comprehensive metabolic panel, CBC with Differential/Platelet  Hypothyroidism, unspecified type - Plan: levothyroxine (SYNTHROID) 75 MCG tablet, TSH, TSH  Needs flu shot - Plan: Flu Vaccine QUAD 6+ mos PF IM (Fluarix Quad PF)  Vitamin D deficiency - Plan: VITAMIN D 25 Hydroxy (Vit-D Deficiency, Fractures), VITAMIN D 25 Hydroxy (Vit-D Deficiency, Fractures)  Bilateral chronic knee pain - Plan: Ambulatory referral to Sports Medicine  Eczema, unspecified type - Plan: triamcinolone cream (KENALOG) 0.1 %    -Recommend routine eye and dental care. -Immunizations: Flu vaccine in  office today, advised to consider COVID and RSV vaccines at pharmacy -Healthy lifestyle discussed in detail. -Labs to be updated today. -Colon cancer screening: 09/2021 -Breast cancer screening: 03/2021 -Cervical cancer screening: 01/2020 -Lung cancer screening: Not applicable -Prostate cancer screening: Not applicable -DEXA: Not applicable  -Triamcinolone cream prescribed as needed for hand eczema. -I will refer to sports medicine for evaluation of chronic knee pain.  I suspect this is simply chronic osteoarthritis.    [name], MD [facility]

## 2021-12-06 NOTE — Progress Notes (Signed)
Traci Houston D.[place] Phone: [phone]   Assessment and Plan:     1. Chronic pain of both knees 2. Bilateral primary osteoarthritis of knee  -Chronic with exacerbation, initial sports medicine visit - Consistent with flare of osteoarthritis in bilateral knees - Patient elected for bilateral knee CSI.  Tolerated well per note below - X-rays obtained in clinic.  My interpretation: No acute fracture or dislocation.  Decreased joint space in bilateral medial compartments and lateral patellar spurring - Start HEP for bilateral knees  Procedure: Knee Joint Injection Side: Bilateral Indication: Flare of osteoarthritis  Risks explained and consent was given verbally. The site was cleaned with alcohol prep. A needle was introduced with an anterio-lateral approach. Injection given using 44m of 1% lidocaine without epinephrine and 11mof kenalog '40mg'$ /ml. This was well tolerated and resulted in symptomatic relief.  Needle was removed, hemostasis achieved, and post injection instructions were explained.  Procedure was repeated on contralateral side.  Pt was advised to call or return to clinic if these symptoms worsen or fail to improve as anticipated.   Pertinent previous records reviewed include none   Follow Up: 3 to 4 weeks for reevaluation.  Could consider physical therapy versus hyaluronic acid injection if no improvement or worsening of symptoms   Subjective:   I, Traci Houston, am serving as a scEducation administratoror Doctor BeGlennon MacChief Complaint: bilateral knee pain   HPI:   12/10/2021 Patient is a [age]ear old female complaining of bilateral knee pain. Patient states that her knee make a noise they have hurt for a few years but now they are really painful, no numbness or tingling, no radiating pain, when she uses meds the pain goes away but they pain comes back , uses move free and the pain always comes  back , no MOI, no pain going up and down steps, but bending she has pain   Relevant Historical Information: Hypothyroidism  Additional pertinent review of systems negative.   Current Outpatient Medications:    AMBULATORY NON FORMULARY MEDICATION, Medication Name: GI Cocktail equal parts of dicyclomine, maalox, 2% viscous lidocaine Take 1538mvery 6 hours as needed, Disp: 240 mL, Rfl: 2   levothyroxine (SYNTHROID) 75 MCG tablet, Take 1 tablet (75 mcg total) by mouth daily before breakfast., Disp: 90 tablet, Rfl: 1   Multiple Vitamin (MULTIVITAMIN) tablet, Take 1 tablet by mouth daily., Disp: , Rfl:    pantoprazole (PROTONIX) 40 MG tablet, TAKE 1 TABLET(40 MG) BY MOUTH TWICE DAILY (Patient taking differently: 40 mg daily.), Disp: 180 tablet, Rfl: 0   triamcinolone cream (KENALOG) 0.1 %, Apply 1 Application topically 2 (two) times daily., Disp: 30 g, Rfl: 0  Current Facility-Administered Medications:    0.9 %  sodium chloride infusion, 500 mL, Intravenous, Once, GupJackquline DenmarkD   0.9 %  sodium chloride infusion, 500 mL, Intravenous, Once, GupJackquline DenmarkD   Objective:     Vitals:   12/10/21 0917  BP: 132/78  Pulse: 74  SpO2: 99%  Weight: 96 lb (43.5 kg)  Height: '5\' 1"'$  (1.549 m)      Body mass index is 18.14 kg/m.    Physical Exam:    General:  awake, alert oriented, no acute distress nontoxic Skin: no suspicious lesions or rashes Neuro:sensation intact, no deficits, strength 5/5 with no deficits, no atrophy, normal muscle tone Psych: No signs of anxiety, depression or other mood disorder  Bilateral  knee: Bilateral knee crepitus No swelling No deformity Neg fluid wave, joint milking ROM Flex 110 , Ext 0  NTTP over the quad tendon, medial fem condyle, lat fem condyle, patella, plica, patella tendon, tibial tuberostiy, fibular head, posterior fossa, pes anserine bursa, gerdy's tubercle, medial jt line, lateral jt line Neg anterior and posterior drawer Neg lachman Neg sag  sign Negative varus stress Negative valgus stress Negative [place]    Gait normal    Electronically signed by:  Traci Houston D.[name] 9:43 AM 12/10/21

## 2021-12-10 ENCOUNTER — Ambulatory Visit: Payer: BC Managed Care – PPO | Admitting: Sports Medicine

## 2021-12-10 ENCOUNTER — Ambulatory Visit (INDEPENDENT_AMBULATORY_CARE_PROVIDER_SITE_OTHER): Payer: BC Managed Care – PPO

## 2021-12-10 VITALS — BP 132/78 | HR 74 | Ht 61.0 in | Wt 96.0 lb

## 2021-12-10 DIAGNOSIS — M1711 Unilateral primary osteoarthritis, right knee: Secondary | ICD-10-CM | POA: Diagnosis not present

## 2021-12-10 DIAGNOSIS — G8929 Other chronic pain: Secondary | ICD-10-CM | POA: Diagnosis not present

## 2021-12-10 DIAGNOSIS — M25561 Pain in right knee: Secondary | ICD-10-CM | POA: Diagnosis not present

## 2021-12-10 DIAGNOSIS — M25562 Pain in left knee: Secondary | ICD-10-CM

## 2021-12-10 DIAGNOSIS — M170 Bilateral primary osteoarthritis of knee: Secondary | ICD-10-CM | POA: Diagnosis not present

## 2021-12-10 DIAGNOSIS — M1712 Unilateral primary osteoarthritis, left knee: Secondary | ICD-10-CM | POA: Diagnosis not present

## 2021-12-10 NOTE — Patient Instructions (Addendum)
Good to see you Knee HEP 3-4 week follow up   

## 2022-01-04 NOTE — Progress Notes (Unsigned)
    Traci Houston D.[place] Phone: [phone]   Assessment and Plan:     There are no diagnoses linked to this encounter.  ***   Pertinent previous records reviewed include ***   Follow Up: ***     Subjective:   I, Traci Houston, am serving as a Education administrator for Doctor Glennon Mac   Chief Complaint: bilateral knee pain    HPI:    12/10/2021 Patient is a 61 year old female complaining of bilateral knee pain. Patient states that her knee make a noise they have hurt for a few years but now they are really painful, no numbness or tingling, no radiating pain, when she uses meds the pain goes away but they pain comes back , uses move free and the pain always comes back , no MOI, no pain going up and down steps, but bending she has pain   01/07/2022 Patient states   Relevant Historical Information: Hypothyroidism  Additional pertinent review of systems negative.   Current Outpatient Medications:    AMBULATORY NON FORMULARY MEDICATION, Medication Name: GI Cocktail equal parts of dicyclomine, maalox, 2% viscous lidocaine Take 79m every 6 hours as needed, Disp: 240 mL, Rfl: 2   levothyroxine (SYNTHROID) 75 MCG tablet, Take 1 tablet (75 mcg total) by mouth daily before breakfast., Disp: 90 tablet, Rfl: 1   Multiple Vitamin (MULTIVITAMIN) tablet, Take 1 tablet by mouth daily., Disp: , Rfl:    pantoprazole (PROTONIX) 40 MG tablet, TAKE 1 TABLET(40 MG) BY MOUTH TWICE DAILY (Patient taking differently: 40 mg daily.), Disp: 180 tablet, Rfl: 0   triamcinolone cream (KENALOG) 0.1 %, Apply 1 Application topically 2 (two) times daily., Disp: 30 g, Rfl: 0  Current Facility-Administered Medications:    0.9 %  sodium chloride infusion, 500 mL, Intravenous, Once, GJackquline Denmark MD   0.9 %  sodium chloride infusion, 500 mL, Intravenous, Once, GJackquline Denmark MD   Objective:     There were no vitals filed for this  visit.    There is no height or weight on file to calculate BMI.    Physical Exam:    ***   Electronically signed by:  BBenito MccreedyD.OMarguerita MerlesSports Medicine 7:54 AM 01/04/22

## 2022-01-07 ENCOUNTER — Ambulatory Visit: Payer: BC Managed Care – PPO | Admitting: Sports Medicine

## 2022-01-07 VITALS — BP 118/78 | Ht 61.0 in | Wt 98.0 lb

## 2022-01-07 DIAGNOSIS — M170 Bilateral primary osteoarthritis of knee: Secondary | ICD-10-CM

## 2022-01-07 DIAGNOSIS — G8929 Other chronic pain: Secondary | ICD-10-CM | POA: Diagnosis not present

## 2022-01-07 DIAGNOSIS — M25561 Pain in right knee: Secondary | ICD-10-CM

## 2022-01-07 DIAGNOSIS — M25562 Pain in left knee: Secondary | ICD-10-CM | POA: Diagnosis not present

## 2022-01-07 NOTE — Patient Instructions (Addendum)
Good to see you Bilat HA injections  1 week follow up

## 2022-01-08 ENCOUNTER — Telehealth: Payer: Self-pay | Admitting: *Deleted

## 2022-01-08 NOTE — Telephone Encounter (Signed)
Visco injections initiated through portals. Awaiting approval.

## 2022-01-10 NOTE — Telephone Encounter (Signed)
Prior auth faxed to [employer].

## 2022-01-14 NOTE — Progress Notes (Unsigned)
    Traci Houston D.[place] Phone: [phone]   Assessment and Plan:     There are no diagnoses linked to this encounter.  ***   Pertinent previous records reviewed include ***   Follow Up: ***     Subjective:   I, Traci Houston, am serving as a Education administrator for Doctor Glennon Mac   Chief Complaint: bilateral knee pain    HPI:    12/10/2021 Patient is a 61 year old female complaining of bilateral knee pain. Patient states that her knee make a noise they have hurt for a few years but now they are really painful, no numbness or tingling, no radiating pain, when she uses meds the pain goes away but they pain comes back , uses move free and the pain always comes back , no MOI, no pain going up and down steps, but bending she has pain    01/07/2022 Patient states still making the noise and a little bit of pain    01/15/2022 Patients states    Relevant Historical Information: Hypothyroidism Additional pertinent review of systems negative.   Current Outpatient Medications:    AMBULATORY NON FORMULARY MEDICATION, Medication Name: GI Cocktail equal parts of dicyclomine, maalox, 2% viscous lidocaine Take 57m every 6 hours as needed, Disp: 240 mL, Rfl: 2   levothyroxine (SYNTHROID) 75 MCG tablet, Take 1 tablet (75 mcg total) by mouth daily before breakfast., Disp: 90 tablet, Rfl: 1   Multiple Vitamin (MULTIVITAMIN) tablet, Take 1 tablet by mouth daily., Disp: , Rfl:    pantoprazole (PROTONIX) 40 MG tablet, TAKE 1 TABLET(40 MG) BY MOUTH TWICE DAILY (Patient taking differently: 40 mg daily.), Disp: 180 tablet, Rfl: 0   triamcinolone cream (KENALOG) 0.1 %, Apply 1 Application topically 2 (two) times daily., Disp: 30 g, Rfl: 0  Current Facility-Administered Medications:    0.9 %  sodium chloride infusion, 500 mL, Intravenous, Once, GJackquline Denmark MD   0.9 %  sodium chloride infusion, 500 mL, Intravenous, Once,  GJackquline Denmark MD   Objective:     There were no vitals filed for this visit.    There is no height or weight on file to calculate BMI.    Physical Exam:    ***   Electronically signed by:  BBenito MccreedyD.[facility] 12:25 PM 01/14/22

## 2022-01-15 ENCOUNTER — Ambulatory Visit (INDEPENDENT_AMBULATORY_CARE_PROVIDER_SITE_OTHER): Payer: BC Managed Care – PPO | Admitting: Sports Medicine

## 2022-01-15 VITALS — BP 122/72 | HR 85 | Ht 61.0 in | Wt 97.0 lb

## 2022-01-15 DIAGNOSIS — M170 Bilateral primary osteoarthritis of knee: Secondary | ICD-10-CM | POA: Diagnosis not present

## 2022-01-15 DIAGNOSIS — M25561 Pain in right knee: Secondary | ICD-10-CM | POA: Diagnosis not present

## 2022-01-15 DIAGNOSIS — M25562 Pain in left knee: Secondary | ICD-10-CM

## 2022-01-15 DIAGNOSIS — G8929 Other chronic pain: Secondary | ICD-10-CM | POA: Diagnosis not present

## 2022-01-15 MED ORDER — HYALURONAN 30 MG/2ML IX SOSY
30.0000 mg | PREFILLED_SYRINGE | Freq: Once | INTRA_ARTICULAR | Status: AC
  Administered 2022-01-15: 30 mg via INTRA_ARTICULAR

## 2022-01-15 NOTE — Patient Instructions (Signed)
Good to see you  Knee HEP

## 2022-01-15 NOTE — Telephone Encounter (Signed)
Bilateral orthovisc approved. 01/10/22-07/08/2022

## 2022-01-21 NOTE — Progress Notes (Unsigned)
    [name] D.[place] Phone: [phone]   Assessment and Plan:     There are no diagnoses linked to this encounter.  ***   Pertinent previous records reviewed include ***   Follow Up: ***     Subjective:   I, Traci Houston, am serving as a Education administrator for Doctor Glennon Mac   Chief Complaint: bilateral knee pain    HPI:    12/10/2021 Patient is a 61 year old female complaining of bilateral knee pain. Patient states that her knee make a noise they have hurt for a few years but now they are really painful, no numbness or tingling, no radiating pain, when she uses meds the pain goes away but they pain comes back , uses move free and the pain always comes back , no MOI, no pain going up and down steps, but bending she has pain    01/07/2022 Patient states still making the noise and a little bit of pain    01/15/2022 Patients states she is ready for injections , still in a little bit of pain   01/22/2022 Patient states   Relevant Historical Information: Hypothyroidism  Additional pertinent review of systems negative.   Current Outpatient Medications:    AMBULATORY NON FORMULARY MEDICATION, Medication Name: GI Cocktail equal parts of dicyclomine, maalox, 2% viscous lidocaine Take 1m every 6 hours as needed, Disp: 240 mL, Rfl: 2   levothyroxine (SYNTHROID) 75 MCG tablet, Take 1 tablet (75 mcg total) by mouth daily before breakfast., Disp: 90 tablet, Rfl: 1   Multiple Vitamin (MULTIVITAMIN) tablet, Take 1 tablet by mouth daily., Disp: , Rfl:    pantoprazole (PROTONIX) 40 MG tablet, TAKE 1 TABLET(40 MG) BY MOUTH TWICE DAILY (Patient taking differently: 40 mg daily.), Disp: 180 tablet, Rfl: 0   triamcinolone cream (KENALOG) 0.1 %, Apply 1 Application topically 2 (two) times daily., Disp: 30 g, Rfl: 0  Current Facility-Administered Medications:    0.9 %  sodium chloride infusion, 500 mL,  Intravenous, Once, GJackquline Denmark MD   0.9 %  sodium chloride infusion, 500 mL, Intravenous, Once, GJackquline Denmark MD   Objective:     There were no vitals filed for this visit.    There is no height or weight on file to calculate BMI.    Physical Exam:    ***   Electronically signed by:  BBenito MccreedyD.[facility] 4:26 PM 01/21/22

## 2022-01-22 ENCOUNTER — Ambulatory Visit: Payer: BC Managed Care – PPO | Admitting: Sports Medicine

## 2022-01-22 DIAGNOSIS — M170 Bilateral primary osteoarthritis of knee: Secondary | ICD-10-CM

## 2022-01-22 DIAGNOSIS — G8929 Other chronic pain: Secondary | ICD-10-CM

## 2022-01-22 DIAGNOSIS — M25562 Pain in left knee: Secondary | ICD-10-CM

## 2022-01-22 DIAGNOSIS — M25561 Pain in right knee: Secondary | ICD-10-CM | POA: Diagnosis not present

## 2022-01-22 MED ORDER — HYALURONAN 30 MG/2ML IX SOSY
60.0000 mg | PREFILLED_SYRINGE | Freq: Once | INTRA_ARTICULAR | Status: AC
Start: 2022-01-22 — End: 2022-01-22
  Administered 2022-01-22: 60 mg via INTRA_ARTICULAR

## 2022-01-30 NOTE — Progress Notes (Signed)
    Traci Houston D.[place] Phone: [phone]   Assessment and Plan:     1. Chronic pain of both knees 2. Bilateral primary osteoarthritis of knee  -Chronic with exacerbation, subsequent visit - Patient presents today for procedure only bilateral HA injection #3 of 3.  Tolerated well per note below - Patient received only mild improvement after intra-articular CSI in 12/10/2021 - Continue HEP.  Patient has declined PT up to this point, but could be considered at future visit   Procedure: Knee Joint Injection Side: Bilateral Indication: Flare of osteoarthritis   Risks explained and consent was given verbally. The site was cleaned with alcohol prep. A needle was introduced with an anterio-lateral approach. Injection given using 2 mL of Orthovisc 15 mg/ML. This was well tolerated and resulted in symptomatic relief.  Needle was removed, hemostasis achieved, and post injection instructions were explained.  Procedure was repeated on contralateral side.  Pt was advised to call or return to clinic if these symptoms worsen or fail to improve as anticipated.    Pertinent previous records reviewed include none    Follow Up: 2 weeks for reevaluation to discuss how HA injections have benefit patient.  If no improvement or worsening of symptoms, could discuss physical therapy   Subjective:   I, Traci Houston, am serving as a Education administrator for Doctor Glennon Mac   Chief Complaint: bilateral knee pain    HPI:    12/10/2021 Patient is a 61 year old female complaining of bilateral knee pain. Patient states that her knee make a noise they have hurt for a few years but now they are really painful, no numbness or tingling, no radiating pain, when she uses meds the pain goes away but they pain comes back , uses move free and the pain always comes back , no MOI, no pain going up and down steps, but bending she has pain     01/07/2022 Patient states still making the noise and a little bit of pain    01/15/2022 Patients states she is ready for injections , still in a little bit of pain   01/22/2022 Patient states that she presents for Orthovisc injection #2 of 3.   02/04/2022 Patient states   Relevant Historical Information: Hypothyroidism Additional pertinent review of systems negative.   Current Outpatient Medications:    AMBULATORY NON FORMULARY MEDICATION, Medication Name: GI Cocktail equal parts of dicyclomine, maalox, 2% viscous lidocaine Take 4m every 6 hours as needed, Disp: 240 mL, Rfl: 2   levothyroxine (SYNTHROID) 75 MCG tablet, Take 1 tablet (75 mcg total) by mouth daily before breakfast., Disp: 90 tablet, Rfl: 1   Multiple Vitamin (MULTIVITAMIN) tablet, Take 1 tablet by mouth daily., Disp: , Rfl:    pantoprazole (PROTONIX) 40 MG tablet, TAKE 1 TABLET(40 MG) BY MOUTH TWICE DAILY (Patient taking differently: 40 mg daily.), Disp: 180 tablet, Rfl: 0   triamcinolone cream (KENALOG) 0.1 %, Apply 1 Application topically 2 (two) times daily., Disp: 30 g, Rfl: 0  Current Facility-Administered Medications:    0.9 %  sodium chloride infusion, 500 mL, Intravenous, Once, GJackquline Denmark MD   0.9 %  sodium chloride infusion, 500 mL, Intravenous, Once, GJackquline Denmark MD       Electronically signed by:  BBenito MccreedyD.[facility] 10:26 AM 02/04/22

## 2022-02-04 ENCOUNTER — Ambulatory Visit (INDEPENDENT_AMBULATORY_CARE_PROVIDER_SITE_OTHER): Payer: BC Managed Care – PPO | Admitting: Sports Medicine

## 2022-02-04 DIAGNOSIS — M25562 Pain in left knee: Secondary | ICD-10-CM

## 2022-02-04 DIAGNOSIS — M170 Bilateral primary osteoarthritis of knee: Secondary | ICD-10-CM | POA: Diagnosis not present

## 2022-02-04 DIAGNOSIS — M25561 Pain in right knee: Secondary | ICD-10-CM | POA: Diagnosis not present

## 2022-02-04 DIAGNOSIS — G8929 Other chronic pain: Secondary | ICD-10-CM | POA: Diagnosis not present

## 2022-02-04 MED ORDER — HYALURONAN 30 MG/2ML IX SOSY
30.0000 mg | PREFILLED_SYRINGE | Freq: Once | INTRA_ARTICULAR | Status: AC
  Administered 2022-02-04: 30 mg via INTRA_ARTICULAR

## 2022-02-04 MED ORDER — HYALURONAN 30 MG/2ML IX SOSY
30.0000 mg | PREFILLED_SYRINGE | Freq: Once | INTRA_ARTICULAR | Status: AC
Start: 2022-02-04 — End: 2022-02-04
  Administered 2022-02-04: 30 mg via INTRA_ARTICULAR

## 2022-02-05 ENCOUNTER — Ambulatory Visit: Payer: BC Managed Care – PPO | Admitting: Sports Medicine

## 2022-02-18 NOTE — Progress Notes (Unsigned)
    Traci Houston D.[place] Phone: [phone]   Assessment and Plan:     There are no diagnoses linked to this encounter.  ***   Pertinent previous records reviewed include ***   Follow Up: ***     Subjective:   I, Traci Houston, am serving as a Education administrator for Doctor Glennon Mac   Chief Complaint: bilateral knee pain    HPI:    12/10/2021 Patient is a 61 year old female complaining of bilateral knee pain. Patient states that her knee make a noise they have hurt for a few years but now they are really painful, no numbness or tingling, no radiating pain, when she uses meds the pain goes away but they pain comes back , uses move free and the pain always comes back , no MOI, no pain going up and down steps, but bending she has pain    01/07/2022 Patient states still making the noise and a little bit of pain    01/15/2022 Patients states she is ready for injections , still in a little bit of pain   01/22/2022 Patient states that she presents for Orthovisc injection #2 of 3.   02/04/2022 Patient states  02/19/2022 Patient states      Relevant Historical Information: Hypothyroidism  Additional pertinent review of systems negative.   Current Outpatient Medications:    AMBULATORY NON FORMULARY MEDICATION, Medication Name: GI Cocktail equal parts of dicyclomine, maalox, 2% viscous lidocaine Take 3m every 6 hours as needed, Disp: 240 mL, Rfl: 2   levothyroxine (SYNTHROID) 75 MCG tablet, Take 1 tablet (75 mcg total) by mouth daily before breakfast., Disp: 90 tablet, Rfl: 1   Multiple Vitamin (MULTIVITAMIN) tablet, Take 1 tablet by mouth daily., Disp: , Rfl:    pantoprazole (PROTONIX) 40 MG tablet, TAKE 1 TABLET(40 MG) BY MOUTH TWICE DAILY (Patient taking differently: 40 mg daily.), Disp: 180 tablet, Rfl: 0   triamcinolone cream (KENALOG) 0.1 %, Apply 1 Application topically 2 (two) times daily.,  Disp: 30 g, Rfl: 0  Current Facility-Administered Medications:    0.9 %  sodium chloride infusion, 500 mL, Intravenous, Once, GJackquline Denmark MD   0.9 %  sodium chloride infusion, 500 mL, Intravenous, Once, GJackquline Denmark MD   Objective:     There were no vitals filed for this visit.    There is no height or weight on file to calculate BMI.    Physical Exam:    ***   Electronically signed by:  BBenito MccreedyD.[facility] 11:54 AM 02/18/22

## 2022-02-19 ENCOUNTER — Ambulatory Visit: Payer: BC Managed Care – PPO | Admitting: Sports Medicine

## 2022-02-19 VITALS — HR 89 | Ht 61.0 in | Wt 97.0 lb

## 2022-02-19 DIAGNOSIS — G8929 Other chronic pain: Secondary | ICD-10-CM

## 2022-02-19 DIAGNOSIS — M25561 Pain in right knee: Secondary | ICD-10-CM | POA: Diagnosis not present

## 2022-02-19 DIAGNOSIS — M170 Bilateral primary osteoarthritis of knee: Secondary | ICD-10-CM | POA: Diagnosis not present

## 2022-02-19 DIAGNOSIS — M25562 Pain in left knee: Secondary | ICD-10-CM | POA: Diagnosis not present

## 2022-02-19 NOTE — Patient Instructions (Addendum)
Good to see you  Call us if you would like a referral to PT  Follow up in 6 months for repeat HA injections Can call and follow up sooner if pain return before 6 months continue HEP

## 2022-03-18 ENCOUNTER — Other Ambulatory Visit: Payer: Self-pay | Admitting: Internal Medicine

## 2022-03-18 DIAGNOSIS — Z1231 Encounter for screening mammogram for malignant neoplasm of breast: Secondary | ICD-10-CM

## 2022-04-03 ENCOUNTER — Other Ambulatory Visit: Payer: Self-pay | Admitting: Internal Medicine

## 2022-04-03 DIAGNOSIS — E039 Hypothyroidism, unspecified: Secondary | ICD-10-CM

## 2022-05-09 ENCOUNTER — Ambulatory Visit: Payer: BC Managed Care – PPO

## 2022-05-31 ENCOUNTER — Ambulatory Visit
Admission: RE | Admit: 2022-05-31 | Discharge: 2022-05-31 | Disposition: A | Discharge status: Home / Self Care | Payer: BC Managed Care – PPO | Source: Ambulatory Visit | Attending: Internal Medicine | Admitting: Internal Medicine

## 2022-05-31 DIAGNOSIS — Z1231 Encounter for screening mammogram for malignant neoplasm of breast: Secondary | ICD-10-CM | POA: Diagnosis not present

## 2022-09-25 ENCOUNTER — Other Ambulatory Visit: Payer: Self-pay | Admitting: Internal Medicine

## 2022-09-25 DIAGNOSIS — E039 Hypothyroidism, unspecified: Secondary | ICD-10-CM

## 2022-12-09 ENCOUNTER — Encounter: Payer: Self-pay | Admitting: Internal Medicine

## 2022-12-09 ENCOUNTER — Ambulatory Visit (INDEPENDENT_AMBULATORY_CARE_PROVIDER_SITE_OTHER): Payer: BC Managed Care – PPO | Admitting: Internal Medicine

## 2022-12-09 ENCOUNTER — Telehealth: Payer: Self-pay | Admitting: Internal Medicine

## 2022-12-09 VITALS — BP 98/64 | HR 58 | Temp 98.2°F | Ht 60.5 in | Wt 92.6 lb

## 2022-12-09 DIAGNOSIS — Z0000 Encounter for general adult medical examination without abnormal findings: Secondary | ICD-10-CM

## 2022-12-09 DIAGNOSIS — Z1159 Encounter for screening for other viral diseases: Secondary | ICD-10-CM

## 2022-12-09 DIAGNOSIS — L819 Disorder of pigmentation, unspecified: Secondary | ICD-10-CM

## 2022-12-09 DIAGNOSIS — Z23 Encounter for immunization: Secondary | ICD-10-CM | POA: Diagnosis not present

## 2022-12-09 DIAGNOSIS — L309 Dermatitis, unspecified: Secondary | ICD-10-CM | POA: Diagnosis not present

## 2022-12-09 DIAGNOSIS — Z114 Encounter for screening for human immunodeficiency virus [HIV]: Secondary | ICD-10-CM | POA: Diagnosis not present

## 2022-12-09 DIAGNOSIS — E559 Vitamin D deficiency, unspecified: Secondary | ICD-10-CM

## 2022-12-09 DIAGNOSIS — E039 Hypothyroidism, unspecified: Secondary | ICD-10-CM | POA: Diagnosis not present

## 2022-12-09 DIAGNOSIS — R0789 Other chest pain: Secondary | ICD-10-CM

## 2022-12-09 LAB — CBC WITH DIFFERENTIAL/PLATELET
Basophils Absolute: 0 10*3/uL (ref 0.0–0.1)
Basophils Relative: 0.5 % (ref 0.0–3.0)
Eosinophils Absolute: 0 10*3/uL (ref 0.0–0.7)
Eosinophils Relative: 0.5 % (ref 0.0–5.0)
HCT: 42.5 % (ref 36.0–46.0)
Hemoglobin: 13.6 g/dL (ref 12.0–15.0)
Lymphocytes Relative: 16.9 % (ref 12.0–46.0)
Lymphs Abs: 1 10*3/uL (ref 0.7–4.0)
MCHC: 32.1 g/dL (ref 30.0–36.0)
MCV: 89.6 fL (ref 78.0–100.0)
Monocytes Absolute: 0.3 10*3/uL (ref 0.1–1.0)
Monocytes Relative: 4.8 % (ref 3.0–12.0)
Neutro Abs: 4.7 10*3/uL (ref 1.4–7.7)
Neutrophils Relative %: 77.3 % — ABNORMAL HIGH (ref 43.0–77.0)
Platelets: 324 10*3/uL (ref 150.0–400.0)
RBC: 4.74 Mil/uL (ref 3.87–5.11)
RDW: 13 % (ref 11.5–15.5)
WBC: 6 10*3/uL (ref 4.0–10.5)

## 2022-12-09 LAB — COMPREHENSIVE METABOLIC PANEL
ALT: 8 U/L (ref 0–35)
AST: 19 U/L (ref 0–37)
Albumin: 4.3 g/dL (ref 3.5–5.2)
Alkaline Phosphatase: 44 U/L (ref 39–117)
BUN: 11 mg/dL (ref 6–23)
CO2: 31 meq/L (ref 19–32)
Calcium: 9.4 mg/dL (ref 8.4–10.5)
Chloride: 102 meq/L (ref 96–112)
Creatinine, Ser: 0.59 mg/dL (ref 0.40–1.20)
GFR: 96.66 mL/min (ref >60.00)
Glucose, Bld: 86 mg/dL (ref 70–99)
Potassium: 3.4 meq/L — ABNORMAL LOW (ref 3.5–5.1)
Sodium: 142 meq/L (ref 135–145)
Total Bilirubin: 0.8 mg/dL (ref 0.2–1.2)
Total Protein: 7.4 g/dL (ref 6.0–8.3)

## 2022-12-09 LAB — LIPID PANEL
Cholesterol: 214 mg/dL — ABNORMAL HIGH (ref 0–200)
HDL: 70.2 mg/dL (ref >39.00)
LDL Cholesterol: 125 mg/dL — ABNORMAL HIGH (ref 0–99)
NonHDL: 144.08
Total CHOL/HDL Ratio: 3
Triglycerides: 93 mg/dL (ref 0.0–149.0)
VLDL: 18.6 mg/dL (ref 0.0–40.0)

## 2022-12-09 LAB — VITAMIN B12: Vitamin B-12: 452 pg/mL (ref 211–911)

## 2022-12-09 LAB — VITAMIN D 25 HYDROXY (VIT D DEFICIENCY, FRACTURES): VITD: 35.87 ng/mL (ref 30.00–100.00)

## 2022-12-09 LAB — TSH: TSH: 1.19 u[IU]/mL (ref 0.35–5.50)

## 2022-12-09 MED ORDER — PANTOPRAZOLE SODIUM 40 MG PO TBEC: DELAYED_RELEASE_TABLET | ORAL | 1 refills | Status: DC

## 2022-12-09 MED ORDER — TRIAMCINOLONE ACETONIDE 0.1 % EX CREA: 1.0000 | TOPICAL_CREAM | Freq: Two times a day (BID) | CUTANEOUS | 0 refills | Status: AC

## 2022-12-09 MED ORDER — PANTOPRAZOLE SODIUM 40 MG PO TBEC
DELAYED_RELEASE_TABLET | ORAL | 0 refills | Status: DC
Start: 2022-12-09 — End: 2022-12-09

## 2022-12-09 MED ORDER — TRETINOIN 0.025 % EX CREA: TOPICAL_CREAM | Freq: Every day | CUTANEOUS | 0 refills | Status: AC

## 2022-12-09 MED ORDER — LEVOTHYROXINE SODIUM 75 MCG PO TABS
75.0000 ug | ORAL_TABLET | Freq: Every day | ORAL | 0 refills | Status: DC
Start: 2022-12-09 — End: 2023-03-17

## 2022-12-09 NOTE — Progress Notes (Signed)
Established Patient Office Visit     CC/Reason for Visit: Annual preventive exam, discuss acute concern  HPI: Christabelle Sahory Nordling is a 62 y.o. female who is coming in today for the above mentioned reasons. Past Medical History is significant for: Vitamin D deficiency and hypothyroidism.  Has been feeling well.  She has some hyperpigmentation of the dorsal aspect of both hands and her face and is wanting something for this.  Has routine eye and dental care.  Requesting flu vaccine.   Past Medical/Surgical History: Past Medical History:  Diagnosis Date   Diastolic CHF (HCC) 09/08/2015   -mild on echo 2017 for murmur    GERD (gastroesophageal reflux disease)    Heart murmur 09/08/2015   Echo 2017: -Normal LV systolic function; grade 1 diastolic dysfunction; trace   AI; mild MR and TR.    Thyroid disease     Past Surgical History:  Procedure Laterality Date   COLONOSCOPY     ESOPHAGOGASTRODUODENOSCOPY     on membrane on eye   EYE SURGERY     UPPER GASTROINTESTINAL ENDOSCOPY      Social History:  reports that she has never smoked. She has never used smokeless tobacco. She reports that she does not drink alcohol and does not use drugs.  Allergies: Allergies  Allergen Reactions   No Known Allergies     Family History:  Family History  Problem Relation Age of Onset   Prostate cancer Father    Heart disease Sister    Colon cancer Neg Hx    Breast cancer Neg Hx    Pancreatic cancer Neg Hx    Stomach cancer Neg Hx    Esophageal cancer Neg Hx      Current Outpatient Medications:    AMBULATORY NON FORMULARY MEDICATION, Medication Name: GI Cocktail equal parts of dicyclomine, maalox, 2% viscous lidocaine Take 15ml every 6 hours as needed, Disp: 240 mL, Rfl: 2   Multiple Vitamin (MULTIVITAMIN) tablet, Take 1 tablet by mouth daily., Disp: , Rfl:    tretinoin (RETIN-A) 0.025 % cream, Apply topically at bedtime., Disp: 45 g, Rfl: 0   levothyroxine (SYNTHROID) 75 MCG tablet, Take  1 tablet (75 mcg total) by mouth daily before breakfast., Disp: 90 tablet, Rfl: 0   pantoprazole (PROTONIX) 40 MG tablet, TAKE 1 TABLET(40 MG) BY MOUTH TWICE DAILY Strength: 40 mg, Disp: 180 tablet, Rfl: 0   triamcinolone cream (KENALOG) 0.1 %, Apply 1 Application topically 2 (two) times daily., Disp: 30 g, Rfl: 0  Current Facility-Administered Medications:    0.9 %  sodium chloride infusion, 500 mL, Intravenous, Once, Lynann Bologna, MD   0.9 %  sodium chloride infusion, 500 mL, Intravenous, Once, Lynann Bologna, MD  Review of Systems:  Negative unless indicated in HPI.   Physical Exam: Vitals:   12/09/22 0948  BP: 98/64  Pulse: (!) 58  Temp: 98.2 F (36.8 C)  TempSrc: Oral  SpO2: 98%  Weight: 92 lb 9.6 oz (42 kg)  Height: 5' 0.5" (1.537 m)    Body mass index is 17.79 kg/m.   Physical Exam Vitals reviewed.  Constitutional:      General: She is not in acute distress.    Appearance: Normal appearance. She is not ill-appearing, toxic-appearing or diaphoretic.  HENT:     Head: Normocephalic.     Right Ear: Tympanic membrane, ear canal and external ear normal. There is no impacted cerumen.     Left Ear: Tympanic membrane, ear canal and external  ear normal. There is no impacted cerumen.     Nose: Nose normal.     Mouth/Throat:     Mouth: Mucous membranes are moist.     Pharynx: Oropharynx is clear. No oropharyngeal exudate or posterior oropharyngeal erythema.  Eyes:     General: No scleral icterus.       Right eye: No discharge.        Left eye: No discharge.     Conjunctiva/sclera: Conjunctivae normal.     Pupils: Pupils are equal, round, and reactive to light.  Neck:     Vascular: No carotid bruit.  Cardiovascular:     Rate and Rhythm: Normal rate and regular rhythm.     Pulses: Normal pulses.     Heart sounds: Normal heart sounds.  Pulmonary:     Effort: Pulmonary effort is normal. No respiratory distress.     Breath sounds: Normal breath sounds.  Abdominal:      General: Abdomen is flat. Bowel sounds are normal.     Palpations: Abdomen is soft.  Musculoskeletal:        General: Normal range of motion.     Cervical back: Normal range of motion.  Skin:    General: Skin is warm and dry.     Comments: Hyperpigmentation of face and hands.  Neurological:     General: No focal deficit present.     Mental Status: She is alert and oriented to person, place, and time. Mental status is at baseline.  Psychiatric:        Mood and Affect: Mood normal.        Behavior: Behavior normal.        Thought Content: Thought content normal.        Judgment: Judgment normal.    Flowsheet Row Office Visit from 12/04/2021 in [facility]  PHQ-9 Total Score 5         Impression and Plan:  Encounter for preventive health examination -     CBC with Differential/Platelet; Future -     Lipid panel; Future -     Comprehensive metabolic panel; Future  Hypothyroidism, unspecified type -     TSH; Future -     Vitamin B12; Future -     Levothyroxine Sodium; Take 1 tablet (75 mcg total) by mouth daily before breakfast.  Dispense: 90 tablet; Refill: 0  Vitamin D deficiency -     VITAMIN D 25 Hydroxy (Vit-D Deficiency, Fractures); Future  Atypical chest pain -     Pantoprazole Sodium; TAKE 1 TABLET(40 MG) BY MOUTH TWICE DAILY Strength: 40 mg  Dispense: 180 tablet; Refill: 0  Eczema, unspecified type -     Triamcinolone Acetonide; Apply 1 Application topically 2 (two) times daily.  Dispense: 30 g; Refill: 0  Immunization due  Hyperpigmentation of skin -     Tretinoin; Apply topically at bedtime.  Dispense: 45 g; Refill: 0  Encounter for hepatitis C screening test for low risk patient -     Hepatitis C antibody; Future  Encounter for screening for HIV -     HIV Antibody (routine testing w rflx); Future   -Recommend routine eye and dental care. -Healthy lifestyle discussed in detail. -Labs to be updated today. -Prostate  cancer screening: N/A Health Maintenance  Topic Date Due   HIV Screening  Never done   Hepatitis C Screening  Never done   Flu Shot  10/10/2022   COVID-19 Vaccine (4 - 2023-24 season) 11/10/2022  Mammogram  05/30/2024   Pap with HPV screening  01/19/2025   DTaP/Tdap/Td vaccine (3 - Td or Tdap) 03/18/2026   Colon Cancer Screening  09/18/2031   Zoster (Shingles) Vaccine  Completed   HPV Vaccine  Aged Out     -Flu vaccine administered in office today.  Advised to update COVID and RSV at pharmacy. -All cancer screening is up-to-date. -Tretinoin cream prescribed for hyperpigmentation of face and dorsal hands.     Chaya Jan, MD [facility]

## 2022-12-09 NOTE — Telephone Encounter (Signed)
Spoke with pharmacist and medication list updated.

## 2022-12-09 NOTE — Telephone Encounter (Signed)
Chris from pharmacy is needing clarification on medication for   tretinoin (RETIN-A) 0.025 % cream   Please advise at [phone]

## 2022-12-10 LAB — HEPATITIS C ANTIBODY: Hepatitis C Ab: NONREACTIVE

## 2022-12-10 LAB — HIV ANTIBODY (ROUTINE TESTING W REFLEX): HIV 1&2 Ab, 4th Generation: NONREACTIVE

## 2022-12-19 ENCOUNTER — Encounter: Payer: Self-pay | Admitting: Internal Medicine

## 2022-12-19 DIAGNOSIS — E785 Hyperlipidemia, unspecified: Secondary | ICD-10-CM | POA: Insufficient documentation

## 2022-12-25 ENCOUNTER — Other Ambulatory Visit: Payer: Self-pay | Admitting: *Deleted

## 2022-12-25 ENCOUNTER — Telehealth: Payer: Self-pay

## 2022-12-25 ENCOUNTER — Other Ambulatory Visit (HOSPITAL_COMMUNITY): Payer: Self-pay

## 2022-12-25 DIAGNOSIS — E785 Hyperlipidemia, unspecified: Secondary | ICD-10-CM

## 2022-12-25 NOTE — Telephone Encounter (Signed)
Clinical questions answered and submitted

## 2022-12-25 NOTE — Telephone Encounter (Signed)
Pharmacy Patient Advocate Encounter   Received notification from CoverMyMeds that prior authorization for Tretinoin 0.025% cream is required/requested.   Insurance verification completed.   The patient is insured through [facility] .   Per test claim: PA required; PA started via CoverMyMeds. KEY [id number] . Waiting for clinical questions to populate.

## 2022-12-26 ENCOUNTER — Other Ambulatory Visit (HOSPITAL_COMMUNITY): Payer: Self-pay

## 2022-12-31 NOTE — Telephone Encounter (Signed)
Pharmacy Patient Advocate Encounter  Received notification from [facility] that Prior Authorization for Tretinoin 0.025% cream has been DENIED.  Full denial letter will be uploaded to the media tab. See denial reason below.   PA #/Case ID/Reference #: [id number]    Please be advised we currently do not have a Pharmacist to review denials, therefore you will need to process appeals accordingly as needed. Thanks for your support at this time. Contact for appeals are as follows: Phone: [phone], Fax: [phone]

## 2023-01-07 ENCOUNTER — Telehealth: Payer: Self-pay | Admitting: Internal Medicine

## 2023-01-07 NOTE — Telephone Encounter (Signed)
Left message on machine returning patient's call with aid of interpretor.

## 2023-01-07 NOTE — Telephone Encounter (Signed)
Pt is calling and would like rachel to return her call concerning the cpe she had in sept 2024

## 2023-01-07 NOTE — Telephone Encounter (Signed)
Spoke to the patient via interpretor  Che. Patient states that her office visit 2022-12-19 was not coded correctly.  It should have been coded as a physical.  I gave her the number to [facility] Billing. Please advise.

## 2023-03-14 ENCOUNTER — Other Ambulatory Visit: Payer: Self-pay | Admitting: Internal Medicine

## 2023-03-14 DIAGNOSIS — E039 Hypothyroidism, unspecified: Secondary | ICD-10-CM

## 2023-05-05 ENCOUNTER — Other Ambulatory Visit: Payer: Self-pay | Admitting: Internal Medicine

## 2023-05-05 DIAGNOSIS — Z0000 Encounter for general adult medical examination without abnormal findings: Secondary | ICD-10-CM

## 2023-05-06 ENCOUNTER — Other Ambulatory Visit (INDEPENDENT_AMBULATORY_CARE_PROVIDER_SITE_OTHER): Payer: BC Managed Care – PPO

## 2023-05-06 DIAGNOSIS — E785 Hyperlipidemia, unspecified: Secondary | ICD-10-CM

## 2023-05-06 LAB — LIPID PANEL
Cholesterol: 205 mg/dL — ABNORMAL HIGH (ref 0–200)
HDL: 65.1 mg/dL (ref >39.00)
LDL Cholesterol: 119 mg/dL — ABNORMAL HIGH (ref 0–99)
NonHDL: 140.22
Total CHOL/HDL Ratio: 3
Triglycerides: 106 mg/dL (ref 0.0–149.0)
VLDL: 21.2 mg/dL (ref 0.0–40.0)

## 2023-06-02 ENCOUNTER — Ambulatory Visit
Admission: RE | Admit: 2023-06-02 | Discharge: 2023-06-02 | Disposition: A | Discharge status: Home / Self Care | Payer: BC Managed Care – PPO | Source: Ambulatory Visit | Attending: Internal Medicine | Admitting: Internal Medicine

## 2023-06-02 DIAGNOSIS — Z1231 Encounter for screening mammogram for malignant neoplasm of breast: Secondary | ICD-10-CM | POA: Diagnosis not present

## 2023-06-02 DIAGNOSIS — Z0000 Encounter for general adult medical examination without abnormal findings: Secondary | ICD-10-CM

## 2023-06-04 DIAGNOSIS — H2513 Age-related nuclear cataract, bilateral: Secondary | ICD-10-CM | POA: Diagnosis not present

## 2023-08-06 ENCOUNTER — Ambulatory Visit: Admitting: Internal Medicine

## 2023-09-03 ENCOUNTER — Other Ambulatory Visit: Payer: Self-pay | Admitting: Internal Medicine

## 2023-09-03 DIAGNOSIS — E039 Hypothyroidism, unspecified: Secondary | ICD-10-CM

## 2023-12-10 ENCOUNTER — Encounter: Admitting: Internal Medicine

## 2023-12-15 ENCOUNTER — Ambulatory Visit: Admitting: Internal Medicine

## 2023-12-15 ENCOUNTER — Encounter: Payer: Self-pay | Admitting: Internal Medicine

## 2023-12-15 VITALS — BP 98/68 | HR 73 | Temp 97.8°F | Ht 61.0 in | Wt 95.1 lb

## 2023-12-15 DIAGNOSIS — Z23 Encounter for immunization: Secondary | ICD-10-CM

## 2023-12-15 DIAGNOSIS — E785 Hyperlipidemia, unspecified: Secondary | ICD-10-CM

## 2023-12-15 DIAGNOSIS — E559 Vitamin D deficiency, unspecified: Secondary | ICD-10-CM

## 2023-12-15 DIAGNOSIS — Z0000 Encounter for general adult medical examination without abnormal findings: Secondary | ICD-10-CM | POA: Diagnosis not present

## 2023-12-15 DIAGNOSIS — E039 Hypothyroidism, unspecified: Secondary | ICD-10-CM

## 2023-12-15 DIAGNOSIS — Z124 Encounter for screening for malignant neoplasm of cervix: Secondary | ICD-10-CM

## 2023-12-15 LAB — CBC WITH DIFFERENTIAL/PLATELET
Basophils Absolute: 0 K/uL (ref 0.0–0.1)
Basophils Relative: 1 % (ref 0.0–3.0)
Eosinophils Absolute: 0.1 K/uL (ref 0.0–0.7)
Eosinophils Relative: 2.7 % (ref 0.0–5.0)
HCT: 43.2 % (ref 36.0–46.0)
Hemoglobin: 14.2 g/dL (ref 12.0–15.0)
Lymphocytes Relative: 30.1 % (ref 12.0–46.0)
Lymphs Abs: 1.2 K/uL (ref 0.7–4.0)
MCHC: 32.8 g/dL (ref 30.0–36.0)
MCV: 89.5 fl (ref 78.0–100.0)
Monocytes Absolute: 0.3 K/uL (ref 0.1–1.0)
Monocytes Relative: 7.5 % (ref 3.0–12.0)
Neutro Abs: 2.2 K/uL (ref 1.4–7.7)
Neutrophils Relative %: 58.7 % (ref 43.0–77.0)
Platelets: 306 K/uL (ref 150.0–400.0)
RBC: 4.83 Mil/uL (ref 3.87–5.11)
RDW: 13.2 % (ref 11.5–15.5)
WBC: 3.8 K/uL — ABNORMAL LOW (ref 4.0–10.5)

## 2023-12-15 LAB — COMPREHENSIVE METABOLIC PANEL WITH GFR
ALT: 10 U/L (ref 0–35)
AST: 21 U/L (ref 0–37)
Albumin: 4.3 g/dL (ref 3.5–5.2)
Alkaline Phosphatase: 51 U/L (ref 39–117)
BUN: 12 mg/dL (ref 6–23)
CO2: 28 meq/L (ref 19–32)
Calcium: 9.6 mg/dL (ref 8.4–10.5)
Chloride: 101 meq/L (ref 96–112)
Creatinine, Ser: 0.6 mg/dL (ref 0.40–1.20)
GFR: 95.58 mL/min (ref >60.00)
Glucose, Bld: 87 mg/dL (ref 70–99)
Potassium: 3.6 meq/L (ref 3.5–5.1)
Sodium: 140 meq/L (ref 135–145)
Total Bilirubin: 0.7 mg/dL (ref 0.2–1.2)
Total Protein: 7.6 g/dL (ref 6.0–8.3)

## 2023-12-15 LAB — VITAMIN B12: Vitamin B-12: 386 pg/mL (ref 211–911)

## 2023-12-15 LAB — TSH: TSH: 1.9 u[IU]/mL (ref 0.35–5.50)

## 2023-12-15 LAB — LIPID PANEL
Cholesterol: 207 mg/dL — ABNORMAL HIGH (ref 0–200)
HDL: 63.3 mg/dL (ref >39.00)
LDL Cholesterol: 129 mg/dL — ABNORMAL HIGH (ref 0–99)
NonHDL: 143.93
Total CHOL/HDL Ratio: 3
Triglycerides: 77 mg/dL (ref 0.0–149.0)
VLDL: 15.4 mg/dL (ref 0.0–40.0)

## 2023-12-15 LAB — VITAMIN D 25 HYDROXY (VIT D DEFICIENCY, FRACTURES): VITD: 26.57 ng/mL — ABNORMAL LOW (ref 30.00–100.00)

## 2023-12-15 NOTE — Addendum Note (Signed)
 Addended by: KATHRYNE MILLMAN B on: 12/15/2023 11:58 AM   Modules accepted: Orders

## 2023-12-15 NOTE — Progress Notes (Signed)
 Established Patient Office Visit     CC/Reason for Visit: Annual preventive exam  HPI: Traci Houston is a 63 y.o. female who is coming in today for the above mentioned reasons. Past Medical History is significant for: Vitamin D  deficiency, hypothyroidism, GERD.  Feeling well, no major concerns or complaints.  Due for flu, COVID, pneumonia vaccinations.  Mammogram and colonoscopy are up-to-date.  Due for Pap smear.   Past Medical/Surgical History: Past Medical History:  Diagnosis Date   Diastolic CHF (HCC) 09/08/2015   -mild on echo 2017 for murmur    GERD (gastroesophageal reflux disease)    Heart murmur 09/08/2015   Echo 2017: -Normal LV systolic function; grade 1 diastolic dysfunction; trace   AI; mild MR and TR.    Thyroid  disease     Past Surgical History:  Procedure Laterality Date   COLONOSCOPY     ESOPHAGOGASTRODUODENOSCOPY     on membrane on eye   EYE SURGERY     UPPER GASTROINTESTINAL ENDOSCOPY      Social History:  reports that she has never smoked. She has never used smokeless tobacco. She reports that she does not drink alcohol and does not use drugs.  Allergies: Allergies  Allergen Reactions   No Known Allergies     Family History:  Family History  Problem Relation Age of Onset   Prostate cancer Father    Heart disease Sister    Colon cancer Neg Hx    Breast cancer Neg Hx    Pancreatic cancer Neg Hx    Stomach cancer Neg Hx    Esophageal cancer Neg Hx    BRCA 1/2 Neg Hx      Current Outpatient Medications:    AMBULATORY NON FORMULARY MEDICATION, Medication Name: GI Cocktail equal parts of dicyclomine, maalox, 2% viscous lidocaine  Take 15ml every 6 hours as needed, Disp: 240 mL, Rfl: 2   levothyroxine  (SYNTHROID ) 75 MCG tablet, TAKE 1 TABLET BY MOUTH ONCE DAILY BEFORE BREAKFAST, Disp: 90 tablet, Rfl: 0   Multiple Vitamin (MULTIVITAMIN) tablet, Take 1 tablet by mouth daily., Disp: , Rfl:    pantoprazole  (PROTONIX ) 40 MG tablet, TAKE 1  TABLET(40 MG) BY MOUTH TWICE DAILY Strength: 40 mg, Disp: 180 tablet, Rfl: 1   tretinoin  (RETIN-A ) 0.025 % cream, Apply topically at bedtime. (Patient taking differently: Apply 1 g topically at bedtime.), Disp: 45 g, Rfl: 0   triamcinolone  cream (KENALOG ) 0.1 %, Apply 1 Application topically 2 (two) times daily., Disp: 30 g, Rfl: 0  Current Facility-Administered Medications:    0.9 %  sodium chloride  infusion, 500 mL, Intravenous, Once, Charlanne Groom, MD   0.9 %  sodium chloride  infusion, 500 mL, Intravenous, Once, Charlanne Groom, MD  Review of Systems:  Negative unless indicated in HPI.   Physical Exam: Vitals:   12/15/23 0948  BP: 98/68  Pulse: 73  Temp: 97.8 F (36.6 C)  TempSrc: Oral  SpO2: 99%  Weight: 95 lb 1.6 oz (43.1 kg)  Height: 5' 1 (1.549 m)    Body mass index is 17.97 kg/m.   Physical Exam Vitals reviewed.  Constitutional:      General: She is not in acute distress.    Appearance: Normal appearance. She is not ill-appearing, toxic-appearing or diaphoretic.  HENT:     Head: Normocephalic.     Right Ear: Tympanic membrane, ear canal and external ear normal. There is no impacted cerumen.     Left Ear: Tympanic membrane, ear canal and external ear  normal. There is no impacted cerumen.     Nose: Nose normal.     Mouth/Throat:     Mouth: Mucous membranes are moist.     Pharynx: Oropharynx is clear. No oropharyngeal exudate or posterior oropharyngeal erythema.  Eyes:     General: No scleral icterus.       Right eye: No discharge.        Left eye: No discharge.     Conjunctiva/sclera: Conjunctivae normal.     Pupils: Pupils are equal, round, and reactive to light.  Neck:     Vascular: No carotid bruit.  Cardiovascular:     Rate and Rhythm: Normal rate and regular rhythm.     Pulses: Normal pulses.     Heart sounds: Normal heart sounds.  Pulmonary:     Effort: Pulmonary effort is normal. No respiratory distress.     Breath sounds: Normal breath sounds.   Abdominal:     General: Abdomen is flat. Bowel sounds are normal.     Palpations: Abdomen is soft.  Musculoskeletal:        General: Normal range of motion.     Cervical back: Normal range of motion.  Skin:    General: Skin is warm and dry.  Neurological:     General: No focal deficit present.     Mental Status: She is alert and oriented to person, place, and time. Mental status is at baseline.  Psychiatric:        Mood and Affect: Mood normal.        Behavior: Behavior normal.        Thought Content: Thought content normal.        Judgment: Judgment normal.      Impression and Plan:  Encounter for preventive health examination -     Ambulatory referral to Ophthalmology  Hypothyroidism, unspecified type -     TSH; Future  Hyperlipidemia, unspecified hyperlipidemia type -     CBC with Differential/Platelet; Future -     Comprehensive metabolic panel with GFR; Future -     Lipid panel; Future  Vitamin D  deficiency -     Vitamin B12; Future -     VITAMIN D  25 Hydroxy (Vit-D Deficiency, Fractures); Future  Immunization due  Screening for cervical cancer -     Ambulatory referral to Gynecology   -Recommend routine eye and dental care. -Healthy lifestyle discussed in detail. -Labs to be updated today. -Prostate cancer screening: N/A Health Maintenance  Topic Date Due   Pneumococcal Vaccine for age over 47 (1 of 2 - PCV) Never done   Flu Shot  10/10/2023   COVID-19 Vaccine (4 - 2025-26 season) 11/10/2023   Pap with HPV screening  01/19/2025   Breast Cancer Screening  06/01/2025   DTaP/Tdap/Td vaccine (3 - Td or Tdap) 03/18/2026   Colon Cancer Screening  09/18/2031   Hepatitis C Screening  Completed   HIV Screening  Completed   Zoster (Shingles) Vaccine  Completed   Hepatitis B Vaccine  Aged Out   HPV Vaccine  Aged Out   Meningitis B Vaccine  Aged Out     - Flu and PCV 20 in office today. - Referrals placed for GYN and ophthalmology.     Tully Theophilus Andrews, MD [facility]

## 2023-12-16 ENCOUNTER — Other Ambulatory Visit: Payer: Self-pay | Admitting: *Deleted

## 2023-12-16 ENCOUNTER — Ambulatory Visit: Payer: Self-pay | Admitting: Internal Medicine

## 2023-12-16 DIAGNOSIS — E782 Mixed hyperlipidemia: Secondary | ICD-10-CM

## 2023-12-16 DIAGNOSIS — E559 Vitamin D deficiency, unspecified: Secondary | ICD-10-CM

## 2023-12-16 DIAGNOSIS — E039 Hypothyroidism, unspecified: Secondary | ICD-10-CM

## 2023-12-16 MED ORDER — LEVOTHYROXINE SODIUM 75 MCG PO TABS: 75.0000 ug | ORAL_TABLET | Freq: Every day | ORAL | 1 refills | Status: AC

## 2023-12-16 MED ORDER — VITAMIN D (ERGOCALCIFEROL) 1.25 MG (50000 UNIT) PO CAPS: 50000.0000 [IU] | ORAL_CAPSULE | ORAL | 0 refills | Status: AC

## 2023-12-16 MED ORDER — ROSUVASTATIN CALCIUM 5 MG PO TABS: 5.0000 mg | ORAL_TABLET | Freq: Every day | ORAL | 1 refills | Status: AC

## 2024-03-22 ENCOUNTER — Other Ambulatory Visit (INDEPENDENT_AMBULATORY_CARE_PROVIDER_SITE_OTHER)

## 2024-03-22 DIAGNOSIS — E782 Mixed hyperlipidemia: Secondary | ICD-10-CM | POA: Diagnosis not present

## 2024-03-22 DIAGNOSIS — E559 Vitamin D deficiency, unspecified: Secondary | ICD-10-CM | POA: Diagnosis not present

## 2024-03-22 LAB — LIPID PANEL
Cholesterol: 206 mg/dL — ABNORMAL HIGH (ref 28–200)
HDL: 66.5 mg/dL
LDL Cholesterol: 123 mg/dL — ABNORMAL HIGH (ref 10–99)
NonHDL: 139.82
Total CHOL/HDL Ratio: 3
Triglycerides: 86 mg/dL (ref 10.0–149.0)
VLDL: 17.2 mg/dL (ref 0.0–40.0)

## 2024-03-22 LAB — VITAMIN D 25 HYDROXY (VIT D DEFICIENCY, FRACTURES): VITD: 43.52 ng/mL (ref 30.00–100.00)

## 2024-03-23 ENCOUNTER — Ambulatory Visit: Payer: Self-pay | Admitting: Internal Medicine

## 2024-03-24 ENCOUNTER — Telehealth: Payer: Self-pay | Admitting: Internal Medicine

## 2024-03-24 NOTE — Telephone Encounter (Signed)
 Spoke to the patient and explained that her Crestor  should not be making her dizzy.  Patient has scheduled an appointment 03/29/24. Will discuss then.

## 2024-03-24 NOTE — Telephone Encounter (Signed)
 Copied from CRM [id number]. Topic: Clinical - Medication Question >> Mar 24, 2024  2:15 PM Tiffini S wrote: Reason for CRM: Patient states that the medication for  rosuvastatin  (CRESTOR ) 5 MG tablet makes her feel dizzy- asked for a call back to discuss at [phone]

## 2024-03-29 ENCOUNTER — Ambulatory Visit: Admitting: Internal Medicine

## 2024-03-30 ENCOUNTER — Encounter: Payer: Self-pay | Admitting: Internal Medicine

## 2024-03-30 ENCOUNTER — Ambulatory Visit: Admitting: Internal Medicine

## 2024-03-30 ENCOUNTER — Ambulatory Visit (HOSPITAL_BASED_OUTPATIENT_CLINIC_OR_DEPARTMENT_OTHER)
Admission: RE | Admit: 2024-03-30 | Discharge: 2024-03-30 | Disposition: A | Discharge status: Home / Self Care | Source: Ambulatory Visit | Attending: Internal Medicine | Admitting: Internal Medicine

## 2024-03-30 VITALS — BP 102/62 | HR 53 | Temp 97.7°F | Wt 101.8 lb

## 2024-03-30 DIAGNOSIS — R17 Unspecified jaundice: Secondary | ICD-10-CM

## 2024-03-30 LAB — CBC WITH DIFFERENTIAL/PLATELET
Basophils Absolute: 0 K/uL (ref 0.0–0.1)
Basophils Relative: 0.6 % (ref 0.0–3.0)
Eosinophils Absolute: 0.1 K/uL (ref 0.0–0.7)
Eosinophils Relative: 2 % (ref 0.0–5.0)
HCT: 41.6 % (ref 36.0–46.0)
Hemoglobin: 13.6 g/dL (ref 12.0–15.0)
Lymphocytes Relative: 24.2 % (ref 12.0–46.0)
Lymphs Abs: 1.2 K/uL (ref 0.7–4.0)
MCHC: 32.8 g/dL (ref 30.0–36.0)
MCV: 88.8 fl (ref 78.0–100.0)
Monocytes Absolute: 0.5 K/uL (ref 0.1–1.0)
Monocytes Relative: 10.5 % (ref 3.0–12.0)
Neutro Abs: 3.1 K/uL (ref 1.4–7.7)
Neutrophils Relative %: 62.7 % (ref 43.0–77.0)
Platelets: 271 K/uL (ref 150.0–400.0)
RBC: 4.69 Mil/uL (ref 3.87–5.11)
RDW: 13.9 % (ref 11.5–15.5)
WBC: 4.9 K/uL (ref 4.0–10.5)

## 2024-03-30 LAB — COMPREHENSIVE METABOLIC PANEL WITH GFR
ALT: 12 U/L (ref 3–35)
AST: 22 U/L (ref 5–37)
Albumin: 4.1 g/dL (ref 3.5–5.2)
Alkaline Phosphatase: 44 U/L (ref 39–117)
BUN: 14 mg/dL (ref 6–23)
CO2: 33 meq/L — ABNORMAL HIGH (ref 19–32)
Calcium: 9.1 mg/dL (ref 8.4–10.5)
Chloride: 103 meq/L (ref 96–112)
Creatinine, Ser: 0.69 mg/dL (ref 0.40–1.20)
GFR: 92.23 mL/min
Glucose, Bld: 76 mg/dL (ref 70–99)
Potassium: 3.6 meq/L (ref 3.5–5.1)
Sodium: 139 meq/L (ref 135–145)
Total Bilirubin: 0.5 mg/dL (ref 0.2–1.2)
Total Protein: 7.2 g/dL (ref 6.0–8.3)

## 2024-03-30 MED ORDER — IOHEXOL 300 MG/ML  SOLN
100.0000 mL | Freq: Once | INTRAMUSCULAR | Status: AC | PRN
  Administered 2024-03-30: 100 mL via INTRAVENOUS

## 2024-03-30 NOTE — Progress Notes (Signed)
 "    Established Patient Office Visit     CC/Reason for Visit: Yellow tint of skin  HPI: Traci Houston is a 64 y.o. female who is coming in today for the above mentioned reasons.  She has noticed a progressive yellowish tint to her skin over the past couple weeks.  She has also noticed some left upper quadrant abdominal pain and mild nausea.  No bowel changes.   Past Medical/Surgical History: Past Medical History:  Diagnosis Date   Diastolic CHF (HCC) 09/08/2015   -mild on echo 2017 for murmur    GERD (gastroesophageal reflux disease)    Heart murmur 09/08/2015   Echo 2017: -Normal LV systolic function; grade 1 diastolic dysfunction; trace   AI; mild MR and TR.    Thyroid  disease     Past Surgical History:  Procedure Laterality Date   COLONOSCOPY     ESOPHAGOGASTRODUODENOSCOPY     on membrane on eye   EYE SURGERY     UPPER GASTROINTESTINAL ENDOSCOPY      Social History:  reports that she has never smoked. She has never used smokeless tobacco. She reports that she does not drink alcohol and does not use drugs.  Allergies: Allergies[1]  Family History:  Family History  Problem Relation Age of Onset   Prostate cancer Father    Heart disease Sister    Colon cancer Neg Hx    Breast cancer Neg Hx    Pancreatic cancer Neg Hx    Stomach cancer Neg Hx    Esophageal cancer Neg Hx    BRCA 1/2 Neg Hx     Current Medications[2]  Review of Systems:  Negative unless indicated in HPI.   Physical Exam: Vitals:   03/30/24 1259  BP: 102/62  Pulse: (!) 53  Temp: 97.7 F (36.5 C)  TempSrc: Oral  SpO2: 99%  Weight: 101 lb 12.8 oz (46.2 kg)    Body mass index is 19.23 kg/m.   Physical Exam Vitals reviewed.  Constitutional:      Appearance: Normal appearance.  HENT:     Head: Normocephalic and atraumatic.  Eyes:     General: Scleral icterus present.     Conjunctiva/sclera: Conjunctivae normal.  Cardiovascular:     Rate and Rhythm: Normal rate and regular  rhythm.  Pulmonary:     Effort: Pulmonary effort is normal.     Breath sounds: Normal breath sounds.  Skin:    General: Skin is warm and dry.  Neurological:     General: No focal deficit present.     Mental Status: She is alert and oriented to person, place, and time.  Psychiatric:        Mood and Affect: Mood normal.        Behavior: Behavior normal.        Thought Content: Thought content normal.        Judgment: Judgment normal.      Impression and Plan:  Jaundice -     CBC with Differential/Platelet; Future -     Comprehensive metabolic panel with GFR; Future -     Hepatitis panel, acute; Future -     CT ABDOMEN PELVIS W CONTRAST; Future   - Check stat labs including CMP, acute hepatitis panel, sent for stat CT abdomen and pelvis concerned about gallbladder, liver, pancreas etiology.  Time spent:30 minutes reviewing chart, interviewing and examining patient and formulating plan of care.     Tully Theophilus Andrews, MD [facility]     [  1]  Allergies Allergen Reactions   No Known Allergies   [2]  Current Outpatient Medications:    AMBULATORY NON FORMULARY MEDICATION, Medication Name: GI Cocktail equal parts of dicyclomine, maalox, 2% viscous lidocaine  Take 15ml every 6 hours as needed, Disp: 240 mL, Rfl: 2   levothyroxine  (SYNTHROID ) 75 MCG tablet, Take 1 tablet (75 mcg total) by mouth daily before breakfast., Disp: 90 tablet, Rfl: 1   Multiple Vitamin (MULTIVITAMIN) tablet, Take 1 tablet by mouth daily., Disp: , Rfl:    pantoprazole  (PROTONIX ) 40 MG tablet, TAKE 1 TABLET(40 MG) BY MOUTH TWICE DAILY Strength: 40 mg, Disp: 180 tablet, Rfl: 1   rosuvastatin  (CRESTOR ) 5 MG tablet, Take 1 tablet (5 mg total) by mouth daily., Disp: 90 tablet, Rfl: 1   tretinoin  (RETIN-A ) 0.025 % cream, Apply topically at bedtime. (Patient taking differently: Apply 1 g topically at bedtime.), Disp: 45 g, Rfl: 0   triamcinolone  cream (KENALOG ) 0.1 %, Apply 1  Application topically 2 (two) times daily., Disp: 30 g, Rfl: 0  Current Facility-Administered Medications:    0.9 %  sodium chloride  infusion, 500 mL, Intravenous, Once, Charlanne Groom, MD   0.9 %  sodium chloride  infusion, 500 mL, Intravenous, Once, Charlanne Groom, MD  "

## 2024-03-31 ENCOUNTER — Ambulatory Visit: Payer: Self-pay | Admitting: Internal Medicine

## 2024-03-31 DIAGNOSIS — R0789 Other chest pain: Secondary | ICD-10-CM

## 2024-03-31 LAB — HEPATITIS PANEL, ACUTE
Hep A IgM: NONREACTIVE
Hep B C IgM: NONREACTIVE
Hepatitis B Surface Ag: NONREACTIVE
Hepatitis C Ab: NONREACTIVE

## 2024-04-06 MED ORDER — PANTOPRAZOLE SODIUM 40 MG PO TBEC: DELAYED_RELEASE_TABLET | ORAL | 1 refills | Status: AC

## 2024-04-06 NOTE — Telephone Encounter (Signed)
 Patient has not been taking the Rx.  Refill sent.
